# Patient Record
Sex: Male | Born: 1940 | Race: Black or African American | Hispanic: No | Smoking: Never smoker
Health system: Southern US, Community
[De-identification: ages and names within clinical notes are randomized; demographics above are authoritative.]

## PROBLEM LIST (undated history)

## (undated) DIAGNOSIS — I10 Essential (primary) hypertension: Secondary | ICD-10-CM

---

## 2002-12-02 ENCOUNTER — Encounter: Payer: Self-pay | Admitting: *Deleted

## 2002-12-02 ENCOUNTER — Encounter: Payer: Self-pay | Admitting: Emergency Medicine

## 2002-12-02 ENCOUNTER — Inpatient Hospital Stay (HOSPITAL_COMMUNITY): Admission: EM | Admit: 2002-12-02 | Discharge: 2002-12-02 | Payer: Self-pay | Admitting: Emergency Medicine

## 2002-12-30 ENCOUNTER — Encounter: Payer: Self-pay | Admitting: Family Medicine

## 2002-12-30 ENCOUNTER — Ambulatory Visit (HOSPITAL_COMMUNITY): Admission: RE | Admit: 2002-12-30 | Discharge: 2002-12-30 | Payer: Self-pay | Admitting: Family Medicine

## 2003-03-07 ENCOUNTER — Inpatient Hospital Stay (HOSPITAL_COMMUNITY): Admission: RE | Admit: 2003-03-07 | Discharge: 2003-03-11 | Payer: Self-pay | Admitting: Urology

## 2003-03-07 ENCOUNTER — Encounter (INDEPENDENT_AMBULATORY_CARE_PROVIDER_SITE_OTHER): Payer: Self-pay | Admitting: Specialist

## 2005-01-17 ENCOUNTER — Encounter: Admission: RE | Admit: 2005-01-17 | Discharge: 2005-04-17 | Payer: Self-pay | Admitting: Family Medicine

## 2007-02-02 ENCOUNTER — Ambulatory Visit: Payer: Self-pay | Admitting: Family Medicine

## 2007-02-02 DIAGNOSIS — K219 Gastro-esophageal reflux disease without esophagitis: Secondary | ICD-10-CM

## 2007-02-02 DIAGNOSIS — I10 Essential (primary) hypertension: Secondary | ICD-10-CM | POA: Insufficient documentation

## 2007-02-02 DIAGNOSIS — M129 Arthropathy, unspecified: Secondary | ICD-10-CM | POA: Insufficient documentation

## 2007-02-02 DIAGNOSIS — D494 Neoplasm of unspecified behavior of bladder: Secondary | ICD-10-CM | POA: Insufficient documentation

## 2007-02-02 DIAGNOSIS — E785 Hyperlipidemia, unspecified: Secondary | ICD-10-CM

## 2007-02-02 DIAGNOSIS — J309 Allergic rhinitis, unspecified: Secondary | ICD-10-CM | POA: Insufficient documentation

## 2007-02-02 DIAGNOSIS — R1310 Dysphagia, unspecified: Secondary | ICD-10-CM | POA: Insufficient documentation

## 2007-02-03 ENCOUNTER — Encounter (INDEPENDENT_AMBULATORY_CARE_PROVIDER_SITE_OTHER): Payer: Self-pay | Admitting: Family Medicine

## 2007-02-06 ENCOUNTER — Ambulatory Visit (HOSPITAL_COMMUNITY): Admission: RE | Admit: 2007-02-06 | Discharge: 2007-02-06 | Payer: Self-pay | Admitting: Family Medicine

## 2007-02-06 ENCOUNTER — Telehealth (INDEPENDENT_AMBULATORY_CARE_PROVIDER_SITE_OTHER): Payer: Self-pay | Admitting: Family Medicine

## 2007-02-06 ENCOUNTER — Encounter (INDEPENDENT_AMBULATORY_CARE_PROVIDER_SITE_OTHER): Payer: Self-pay | Admitting: Family Medicine

## 2007-02-06 LAB — CONVERTED CEMR LAB
Albumin: 4.2 g/dL (ref 3.5–5.2)
Alkaline Phosphatase: 68 units/L (ref 39–117)
BUN: 12 mg/dL (ref 6–23)
Bilirubin Urine: NEGATIVE
Creatinine, Ser: 1.52 mg/dL — ABNORMAL HIGH (ref 0.40–1.50)
Eosinophils Absolute: 0.1 10*3/uL (ref 0.0–0.7)
Eosinophils Relative: 2 % (ref 0–5)
Glucose, Bld: 89 mg/dL (ref 70–99)
HCT: 26 % — ABNORMAL LOW (ref 39.0–52.0)
HDL: 63 mg/dL (ref >39)
Hemoglobin: 6.5 g/dL — CL (ref 13.0–17.0)
LDL Cholesterol: 104 mg/dL — ABNORMAL HIGH (ref 0–99)
Lymphs Abs: 1 10*3/uL (ref 0.7–3.3)
MCV: 61.6 fL — ABNORMAL LOW (ref 78.0–100.0)
Monocytes Absolute: 0.4 10*3/uL (ref 0.2–0.7)
Monocytes Relative: 8 % (ref 3–11)
Neutrophils Relative %: 70 % (ref 43–77)
Potassium: 4.3 meq/L (ref 3.5–5.3)
RBC / HPF: NONE SEEN (ref <3)
RBC: 4.11 M/uL — ABNORMAL LOW (ref 4.22–5.81)
Specific Gravity, Urine: 1.011 (ref 1.005–1.03)
Triglycerides: 61 mg/dL (ref <150)
Urine Glucose: NEGATIVE mg/dL
WBC: 5.2 10*3/uL (ref 4.0–10.5)

## 2007-02-09 ENCOUNTER — Ambulatory Visit: Payer: Self-pay | Admitting: Family Medicine

## 2007-02-09 ENCOUNTER — Telehealth (INDEPENDENT_AMBULATORY_CARE_PROVIDER_SITE_OTHER): Payer: Self-pay | Admitting: Family Medicine

## 2007-02-09 ENCOUNTER — Ambulatory Visit (HOSPITAL_COMMUNITY): Admission: RE | Admit: 2007-02-09 | Discharge: 2007-02-09 | Payer: Self-pay | Admitting: Family Medicine

## 2007-02-09 DIAGNOSIS — D539 Nutritional anemia, unspecified: Secondary | ICD-10-CM | POA: Insufficient documentation

## 2007-02-09 LAB — CONVERTED CEMR LAB: Hemoglobin: 6.4 g/dL

## 2007-02-10 ENCOUNTER — Encounter (HOSPITAL_COMMUNITY): Admission: RE | Admit: 2007-02-10 | Discharge: 2007-03-12 | Payer: Self-pay | Admitting: Family Medicine

## 2007-02-10 ENCOUNTER — Ambulatory Visit (HOSPITAL_COMMUNITY): Payer: Self-pay | Admitting: Family Medicine

## 2007-02-10 LAB — CONVERTED CEMR LAB
Basophils Absolute: 0.1 10*3/uL (ref 0.0–0.1)
Eosinophils Absolute: 0.2 10*3/uL (ref 0.0–0.7)
Eosinophils Relative: 3 % (ref 0–5)
Ferritin: 1 ng/mL — ABNORMAL LOW (ref 22–322)
HCT: 26.5 % — ABNORMAL LOW (ref 39.0–52.0)
Iron: <10 ug/dL — ABNORMAL LOW (ref 42–165)
MCV: 61.5 fL — ABNORMAL LOW (ref 78.0–100.0)
Platelets: 348 10*3/uL (ref 150–400)
RDW: 18.8 % — ABNORMAL HIGH (ref 11.5–14.0)
Retic Ct Pct: 0.9 % (ref 0.4–3.1)

## 2007-02-11 ENCOUNTER — Telehealth (INDEPENDENT_AMBULATORY_CARE_PROVIDER_SITE_OTHER): Payer: Self-pay | Admitting: *Deleted

## 2007-03-06 ENCOUNTER — Encounter (INDEPENDENT_AMBULATORY_CARE_PROVIDER_SITE_OTHER): Payer: Self-pay | Admitting: Family Medicine

## 2007-03-06 ENCOUNTER — Ambulatory Visit (HOSPITAL_COMMUNITY): Admission: RE | Admit: 2007-03-06 | Discharge: 2007-03-06 | Payer: Self-pay | Admitting: Internal Medicine

## 2007-03-06 ENCOUNTER — Ambulatory Visit: Payer: Self-pay | Admitting: Internal Medicine

## 2007-03-09 ENCOUNTER — Telehealth (INDEPENDENT_AMBULATORY_CARE_PROVIDER_SITE_OTHER): Payer: Self-pay | Admitting: Family Medicine

## 2007-03-10 ENCOUNTER — Telehealth (INDEPENDENT_AMBULATORY_CARE_PROVIDER_SITE_OTHER): Payer: Self-pay | Admitting: Family Medicine

## 2007-03-13 ENCOUNTER — Encounter (INDEPENDENT_AMBULATORY_CARE_PROVIDER_SITE_OTHER): Payer: Self-pay | Admitting: Family Medicine

## 2007-05-19 ENCOUNTER — Encounter (INDEPENDENT_AMBULATORY_CARE_PROVIDER_SITE_OTHER): Payer: Self-pay | Admitting: Family Medicine

## 2007-12-07 ENCOUNTER — Encounter (INDEPENDENT_AMBULATORY_CARE_PROVIDER_SITE_OTHER): Payer: Self-pay | Admitting: Family Medicine

## 2007-12-08 ENCOUNTER — Encounter (INDEPENDENT_AMBULATORY_CARE_PROVIDER_SITE_OTHER): Payer: Self-pay | Admitting: Family Medicine

## 2008-12-08 ENCOUNTER — Ambulatory Visit: Payer: Self-pay | Admitting: Family Medicine

## 2008-12-08 DIAGNOSIS — K047 Periapical abscess without sinus: Secondary | ICD-10-CM

## 2008-12-12 ENCOUNTER — Ambulatory Visit: Payer: Self-pay | Admitting: Family Medicine

## 2009-01-27 ENCOUNTER — Ambulatory Visit: Payer: Self-pay | Admitting: Family Medicine

## 2009-01-27 ENCOUNTER — Inpatient Hospital Stay (HOSPITAL_COMMUNITY): Admission: AD | Admit: 2009-01-27 | Discharge: 2009-02-06 | Payer: Self-pay

## 2009-01-27 LAB — CONVERTED CEMR LAB
Basophils Relative: 1 % (ref 0–1)
HDL goal, serum: 40 mg/dL
MCHC: 28.6 g/dL — ABNORMAL LOW (ref 30.0–36.0)
Monocytes Relative: 7 % (ref 3–12)
Neutro Abs: 7.2 10*3/uL (ref 1.7–7.7)
Neutrophils Relative %: 80 % — ABNORMAL HIGH (ref 43–77)
OCCULT 1: POSITIVE
Platelets: 487 10*3/uL — ABNORMAL HIGH (ref 150–400)
RBC: 3.05 M/uL — ABNORMAL LOW (ref 4.22–5.81)
WBC: 8.8 10*3/uL (ref 4.0–10.5)

## 2009-01-28 ENCOUNTER — Ambulatory Visit: Payer: Self-pay | Admitting: Internal Medicine

## 2009-01-30 ENCOUNTER — Ambulatory Visit: Payer: Self-pay | Admitting: Internal Medicine

## 2009-01-30 ENCOUNTER — Encounter: Payer: Self-pay | Admitting: Internal Medicine

## 2009-01-31 ENCOUNTER — Ambulatory Visit: Payer: Self-pay | Admitting: Oncology

## 2009-02-01 ENCOUNTER — Encounter: Payer: Self-pay | Admitting: Internal Medicine

## 2009-02-01 ENCOUNTER — Encounter (INDEPENDENT_AMBULATORY_CARE_PROVIDER_SITE_OTHER): Payer: Self-pay | Admitting: General Surgery

## 2009-02-07 ENCOUNTER — Encounter: Payer: Self-pay | Admitting: Internal Medicine

## 2009-02-08 ENCOUNTER — Telehealth (INDEPENDENT_AMBULATORY_CARE_PROVIDER_SITE_OTHER): Payer: Self-pay | Admitting: *Deleted

## 2009-02-20 ENCOUNTER — Telehealth (INDEPENDENT_AMBULATORY_CARE_PROVIDER_SITE_OTHER): Payer: Self-pay | Admitting: Family Medicine

## 2009-02-20 ENCOUNTER — Encounter: Payer: Self-pay | Admitting: Internal Medicine

## 2010-03-01 ENCOUNTER — Encounter (INDEPENDENT_AMBULATORY_CARE_PROVIDER_SITE_OTHER): Payer: Self-pay

## 2010-09-17 ENCOUNTER — Encounter: Payer: Self-pay | Admitting: General Surgery

## 2010-09-25 NOTE — Letter (Signed)
Summary: Recall Colonoscopy/Endoscopy, Change to Office Visit  [facility]  [address]   [place], [place] [address]   Phone: [phone]  Fax: [phone]      March 01, 2010   Wayne [name] [address] [facility] [address] [place], [place]  [address] 1940-10-09   Dear [name],   According to our records, it is time for you to schedule a Colonoscopy. Please call our office and ask to speak to the triage nurse to get this scheduled. We look forward to hearing from you soon.   Sincerely,   Cloria Spring LPN  [facility] Ph: [phone]   Fax: [phone]

## 2010-12-03 LAB — COMPREHENSIVE METABOLIC PANEL
ALT: 10 U/L (ref 0–53)
ALT: 10 U/L (ref 0–53)
ALT: 13 U/L (ref 0–53)
ALT: 9 U/L (ref 0–53)
AST: 14 U/L (ref 0–37)
AST: 16 U/L (ref 0–37)
AST: 16 U/L (ref 0–37)
AST: 19 U/L (ref 0–37)
AST: 21 U/L (ref 0–37)
AST: 23 U/L (ref 0–37)
Albumin: 2.5 g/dL — ABNORMAL LOW (ref 3.5–5.2)
Albumin: 2.6 g/dL — ABNORMAL LOW (ref 3.5–5.2)
Albumin: 3.1 g/dL — ABNORMAL LOW (ref 3.5–5.2)
Albumin: 3.4 g/dL — ABNORMAL LOW (ref 3.5–5.2)
Albumin: 3.8 g/dL (ref 3.5–5.2)
Albumin: 3.9 g/dL (ref 3.5–5.2)
Alkaline Phosphatase: 60 U/L (ref 39–117)
Alkaline Phosphatase: 67 U/L (ref 39–117)
Alkaline Phosphatase: 75 U/L (ref 39–117)
BUN: 7 mg/dL (ref 6–23)
BUN: 7 mg/dL (ref 6–23)
BUN: 8 mg/dL (ref 6–23)
CO2: 24 mEq/L (ref 19–32)
CO2: 27 mEq/L (ref 19–32)
Calcium: 10.1 mg/dL (ref 8.4–10.5)
Calcium: 10.3 mg/dL (ref 8.4–10.5)
Calcium: 9.6 mg/dL (ref 8.4–10.5)
Calcium: 9.6 mg/dL (ref 8.4–10.5)
Chloride: 101 mEq/L (ref 96–112)
Chloride: 103 mEq/L (ref 96–112)
Chloride: 105 mEq/L (ref 96–112)
Creatinine, Ser: 1.02 mg/dL (ref 0.4–1.5)
Creatinine, Ser: 1.19 mg/dL (ref 0.4–1.5)
Creatinine, Ser: 1.19 mg/dL (ref 0.4–1.5)
Creatinine, Ser: 1.29 mg/dL (ref 0.4–1.5)
GFR calc Af Amer: >60 mL/min (ref >60)
GFR calc Af Amer: >60 mL/min (ref >60)
GFR calc Af Amer: >60 mL/min (ref >60)
GFR calc Af Amer: >60 mL/min (ref >60)
GFR calc Af Amer: >60 mL/min (ref >60)
GFR calc non Af Amer: 55 mL/min — ABNORMAL LOW (ref >60)
GFR calc non Af Amer: >60 mL/min (ref >60)
GFR calc non Af Amer: >60 mL/min (ref >60)
GFR calc non Af Amer: >60 mL/min (ref >60)
GFR calc non Af Amer: >60 mL/min (ref >60)
Glucose, Bld: 131 mg/dL — ABNORMAL HIGH (ref 70–99)
Potassium: 3.7 mEq/L (ref 3.5–5.1)
Potassium: 3.9 mEq/L (ref 3.5–5.1)
Potassium: 3.9 mEq/L (ref 3.5–5.1)
Sodium: 133 mEq/L — ABNORMAL LOW (ref 135–145)
Sodium: 135 mEq/L (ref 135–145)
Sodium: 136 mEq/L (ref 135–145)
Sodium: 137 mEq/L (ref 135–145)
Sodium: 138 mEq/L (ref 135–145)
Total Bilirubin: 1.2 mg/dL (ref 0.3–1.2)
Total Bilirubin: 1.3 mg/dL — ABNORMAL HIGH (ref 0.3–1.2)
Total Bilirubin: 1.3 mg/dL — ABNORMAL HIGH (ref 0.3–1.2)
Total Protein: 6.1 g/dL (ref 6.0–8.3)
Total Protein: 6.5 g/dL (ref 6.0–8.3)
Total Protein: 6.5 g/dL (ref 6.0–8.3)

## 2010-12-03 LAB — DIFFERENTIAL
Basophils Absolute: 0 10*3/uL (ref 0.0–0.1)
Basophils Absolute: 0 10*3/uL (ref 0.0–0.1)
Basophils Absolute: 0 10*3/uL (ref 0.0–0.1)
Basophils Absolute: 0 10*3/uL (ref 0.0–0.1)
Basophils Absolute: 0.1 10*3/uL (ref 0.0–0.1)
Basophils Absolute: 0.1 10*3/uL (ref 0.0–0.1)
Basophils Absolute: 0.1 10*3/uL (ref 0.0–0.1)
Basophils Absolute: 0.1 10*3/uL (ref 0.0–0.1)
Basophils Absolute: 0.1 10*3/uL (ref 0.0–0.1)
Basophils Absolute: 0.1 10*3/uL (ref 0.0–0.1)
Basophils Absolute: 0.1 10*3/uL (ref 0.0–0.1)
Basophils Absolute: 0.1 10*3/uL (ref 0.0–0.1)
Basophils Absolute: 0.6 10*3/uL — ABNORMAL HIGH (ref 0.0–0.1)
Basophils Relative: 0 % (ref 0–1)
Basophils Relative: 0 % (ref 0–1)
Basophils Relative: 0 % (ref 0–1)
Basophils Relative: 0 % (ref 0–1)
Basophils Relative: 0 % (ref 0–1)
Basophils Relative: 0 % (ref 0–1)
Basophils Relative: 0 % (ref 0–1)
Basophils Relative: 1 % (ref 0–1)
Basophils Relative: 1 % (ref 0–1)
Basophils Relative: 1 % (ref 0–1)
Basophils Relative: 1 % (ref 0–1)
Eosinophils Absolute: 0 10*3/uL (ref 0.0–0.7)
Eosinophils Absolute: 0 10*3/uL (ref 0.0–0.7)
Eosinophils Absolute: 0.1 10*3/uL (ref 0.0–0.7)
Eosinophils Absolute: 0.1 10*3/uL (ref 0.0–0.7)
Eosinophils Absolute: 0.1 10*3/uL (ref 0.0–0.7)
Eosinophils Absolute: 0.1 10*3/uL (ref 0.0–0.7)
Eosinophils Absolute: 0.2 10*3/uL (ref 0.0–0.7)
Eosinophils Absolute: 0.2 10*3/uL (ref 0.0–0.7)
Eosinophils Absolute: 0.2 10*3/uL (ref 0.0–0.7)
Eosinophils Absolute: 0.3 10*3/uL (ref 0.0–0.7)
Eosinophils Absolute: 0.3 10*3/uL (ref 0.0–0.7)
Eosinophils Relative: 0 % (ref 0–5)
Eosinophils Relative: 0 % (ref 0–5)
Eosinophils Relative: 0 % (ref 0–5)
Eosinophils Relative: 0 % (ref 0–5)
Eosinophils Relative: 0 % (ref 0–5)
Eosinophils Relative: 0 % (ref 0–5)
Eosinophils Relative: 0 % (ref 0–5)
Eosinophils Relative: 0 % (ref 0–5)
Eosinophils Relative: 1 % (ref 0–5)
Eosinophils Relative: 1 % (ref 0–5)
Eosinophils Relative: 2 % (ref 0–5)
Eosinophils Relative: 2 % (ref 0–5)
Eosinophils Relative: 2 % (ref 0–5)
Eosinophils Relative: 3 % (ref 0–5)
Eosinophils Relative: 3 % (ref 0–5)
Eosinophils Relative: 3 % (ref 0–5)
Eosinophils Relative: 3 % (ref 0–5)
Lymphocytes Relative: 12 % (ref 12–46)
Lymphocytes Relative: 15 % (ref 12–46)
Lymphocytes Relative: 17 % (ref 12–46)
Lymphocytes Relative: 18 % (ref 12–46)
Lymphocytes Relative: 4 % — ABNORMAL LOW (ref 12–46)
Lymphocytes Relative: 5 % — ABNORMAL LOW (ref 12–46)
Lymphocytes Relative: 5 % — ABNORMAL LOW (ref 12–46)
Lymphocytes Relative: 7 % — ABNORMAL LOW (ref 12–46)
Lymphs Abs: 0.4 10*3/uL — ABNORMAL LOW (ref 0.7–4.0)
Lymphs Abs: 0.5 10*3/uL — ABNORMAL LOW (ref 0.7–4.0)
Lymphs Abs: 0.5 10*3/uL — ABNORMAL LOW (ref 0.7–4.0)
Lymphs Abs: 0.5 10*3/uL — ABNORMAL LOW (ref 0.7–4.0)
Lymphs Abs: 0.6 10*3/uL — ABNORMAL LOW (ref 0.7–4.0)
Lymphs Abs: 0.6 10*3/uL — ABNORMAL LOW (ref 0.7–4.0)
Lymphs Abs: 0.9 10*3/uL (ref 0.7–4.0)
Lymphs Abs: 1 10*3/uL (ref 0.7–4.0)
Lymphs Abs: 1 10*3/uL (ref 0.7–4.0)
Lymphs Abs: 1 10*3/uL (ref 0.7–4.0)
Lymphs Abs: 1 10*3/uL (ref 0.7–4.0)
Lymphs Abs: 1.2 10*3/uL (ref 0.7–4.0)
Lymphs Abs: 1.2 10*3/uL (ref 0.7–4.0)
Lymphs Abs: 1.2 10*3/uL (ref 0.7–4.0)
Lymphs Abs: 1.4 10*3/uL (ref 0.7–4.0)
Lymphs Abs: 1.5 10*3/uL (ref 0.7–4.0)
Lymphs Abs: 1.6 10*3/uL (ref 0.7–4.0)
Metamyelocytes Relative: 0 %
Monocytes Absolute: 0.4 10*3/uL (ref 0.1–1.0)
Monocytes Absolute: 0.5 10*3/uL (ref 0.1–1.0)
Monocytes Absolute: 0.5 10*3/uL (ref 0.1–1.0)
Monocytes Absolute: 0.6 10*3/uL (ref 0.1–1.0)
Monocytes Absolute: 0.6 10*3/uL (ref 0.1–1.0)
Monocytes Absolute: 0.6 10*3/uL (ref 0.1–1.0)
Monocytes Absolute: 0.6 10*3/uL (ref 0.1–1.0)
Monocytes Absolute: 0.7 10*3/uL (ref 0.1–1.0)
Monocytes Absolute: 0.7 10*3/uL (ref 0.1–1.0)
Monocytes Absolute: 0.8 10*3/uL (ref 0.1–1.0)
Monocytes Absolute: 0.8 10*3/uL (ref 0.1–1.0)
Monocytes Absolute: 0.9 10*3/uL (ref 0.1–1.0)
Monocytes Absolute: 1.3 10*3/uL — ABNORMAL HIGH (ref 0.1–1.0)
Monocytes Relative: 1 % — ABNORMAL LOW (ref 3–12)
Monocytes Relative: 11 % (ref 3–12)
Monocytes Relative: 2 % — ABNORMAL LOW (ref 3–12)
Monocytes Relative: 2 % — ABNORMAL LOW (ref 3–12)
Monocytes Relative: 3 % (ref 3–12)
Monocytes Relative: 3 % (ref 3–12)
Monocytes Relative: 4 % (ref 3–12)
Monocytes Relative: 7 % (ref 3–12)
Monocytes Relative: 8 % (ref 3–12)
Monocytes Relative: 8 % (ref 3–12)
Monocytes Relative: 8 % (ref 3–12)
Monocytes Relative: 9 % (ref 3–12)
Myelocytes: 0 %
Myelocytes: 0 %
Neutro Abs: 12.4 10*3/uL — ABNORMAL HIGH (ref 1.7–7.7)
Neutro Abs: 19.1 10*3/uL — ABNORMAL HIGH (ref 1.7–7.7)
Neutro Abs: 23.5 10*3/uL — ABNORMAL HIGH (ref 1.7–7.7)
Neutro Abs: 24.1 10*3/uL — ABNORMAL HIGH (ref 1.7–7.7)
Neutro Abs: 24.8 10*3/uL — ABNORMAL HIGH (ref 1.7–7.7)
Neutro Abs: 26.4 10*3/uL — ABNORMAL HIGH (ref 1.7–7.7)
Neutro Abs: 27 10*3/uL — ABNORMAL HIGH (ref 1.7–7.7)
Neutro Abs: 4.4 10*3/uL (ref 1.7–7.7)
Neutro Abs: 5.1 10*3/uL (ref 1.7–7.7)
Neutro Abs: 5.4 10*3/uL (ref 1.7–7.7)
Neutro Abs: 5.8 10*3/uL (ref 1.7–7.7)
Neutro Abs: 6.1 10*3/uL (ref 1.7–7.7)
Neutro Abs: 6.7 10*3/uL (ref 1.7–7.7)
Neutro Abs: 6.8 10*3/uL (ref 1.7–7.7)
Neutro Abs: 7.8 10*3/uL — ABNORMAL HIGH (ref 1.7–7.7)
Neutro Abs: 8 10*3/uL — ABNORMAL HIGH (ref 1.7–7.7)
Neutro Abs: 8.6 10*3/uL — ABNORMAL HIGH (ref 1.7–7.7)
Neutro Abs: 9 10*3/uL — ABNORMAL HIGH (ref 1.7–7.7)
Neutrophils Relative %: 67 % (ref 43–77)
Neutrophils Relative %: 70 % (ref 43–77)
Neutrophils Relative %: 73 % (ref 43–77)
Neutrophils Relative %: 73 % (ref 43–77)
Neutrophils Relative %: 75 % (ref 43–77)
Neutrophils Relative %: 76 % (ref 43–77)
Neutrophils Relative %: 83 % — ABNORMAL HIGH (ref 43–77)
Neutrophils Relative %: 90 % — ABNORMAL HIGH (ref 43–77)
Neutrophils Relative %: 91 % — ABNORMAL HIGH (ref 43–77)
Smear Review: INCREASED
nRBC: 0 /100 WBC

## 2010-12-03 LAB — CBC
HCT: 21.1 % — ABNORMAL LOW (ref 39.0–52.0)
HCT: 24.2 % — ABNORMAL LOW (ref 39.0–52.0)
HCT: 27 % — ABNORMAL LOW (ref 39.0–52.0)
HCT: 28.5 % — ABNORMAL LOW (ref 39.0–52.0)
HCT: 30.1 % — ABNORMAL LOW (ref 39.0–52.0)
HCT: 30.4 % — ABNORMAL LOW (ref 39.0–52.0)
HCT: 30.4 % — ABNORMAL LOW (ref 39.0–52.0)
HCT: 30.6 % — ABNORMAL LOW (ref 39.0–52.0)
HCT: 31.3 % — ABNORMAL LOW (ref 39.0–52.0)
HCT: 32 % — ABNORMAL LOW (ref 39.0–52.0)
HCT: 32.5 % — ABNORMAL LOW (ref 39.0–52.0)
HCT: 33.3 % — ABNORMAL LOW (ref 39.0–52.0)
HCT: 34.5 % — ABNORMAL LOW (ref 39.0–52.0)
Hemoglobin: 10.4 g/dL — ABNORMAL LOW (ref 13.0–17.0)
Hemoglobin: 10.6 g/dL — ABNORMAL LOW (ref 13.0–17.0)
Hemoglobin: 4.4 g/dL — CL (ref 13.0–17.0)
Hemoglobin: 9.1 g/dL — ABNORMAL LOW (ref 13.0–17.0)
Hemoglobin: 9.1 g/dL — ABNORMAL LOW (ref 13.0–17.0)
Hemoglobin: 9.3 g/dL — ABNORMAL LOW (ref 13.0–17.0)
Hemoglobin: 9.5 g/dL — ABNORMAL LOW (ref 13.0–17.0)
Hemoglobin: 9.6 g/dL — ABNORMAL LOW (ref 13.0–17.0)
Hemoglobin: 9.8 g/dL — ABNORMAL LOW (ref 13.0–17.0)
Hemoglobin: 9.9 g/dL — ABNORMAL LOW (ref 13.0–17.0)
Hemoglobin: 9.9 g/dL — ABNORMAL LOW (ref 13.0–17.0)
MCHC: 30.7 g/dL (ref 30.0–36.0)
MCHC: 30.9 g/dL (ref 30.0–36.0)
MCHC: 31.1 g/dL (ref 30.0–36.0)
MCHC: 31.1 g/dL (ref 30.0–36.0)
MCHC: 31.2 g/dL (ref 30.0–36.0)
MCHC: 31.2 g/dL (ref 30.0–36.0)
MCHC: 31.3 g/dL (ref 30.0–36.0)
MCHC: 31.4 g/dL (ref 30.0–36.0)
MCHC: 31.4 g/dL (ref 30.0–36.0)
MCHC: 31.4 g/dL (ref 30.0–36.0)
MCHC: 31.4 g/dL (ref 30.0–36.0)
MCHC: 31.4 g/dL (ref 30.0–36.0)
MCHC: 31.4 g/dL (ref 30.0–36.0)
MCHC: 31.5 g/dL (ref 30.0–36.0)
MCHC: 31.6 g/dL (ref 30.0–36.0)
MCHC: 31.8 g/dL (ref 30.0–36.0)
MCV: 62.8 fL — ABNORMAL LOW (ref 78.0–100.0)
MCV: 67.3 fL — ABNORMAL LOW (ref 78.0–100.0)
MCV: 67.6 fL — ABNORMAL LOW (ref 78.0–100.0)
MCV: 67.7 fL — ABNORMAL LOW (ref 78.0–100.0)
MCV: 68.2 fL — ABNORMAL LOW (ref 78.0–100.0)
MCV: 68.8 fL — ABNORMAL LOW (ref 78.0–100.0)
MCV: 68.9 fL — ABNORMAL LOW (ref 78.0–100.0)
MCV: 69.9 fL — ABNORMAL LOW (ref 78.0–100.0)
MCV: 70.3 fL — ABNORMAL LOW (ref 78.0–100.0)
MCV: 70.4 fL — ABNORMAL LOW (ref 78.0–100.0)
MCV: 70.4 fL — ABNORMAL LOW (ref 78.0–100.0)
MCV: 70.8 fL — ABNORMAL LOW (ref 78.0–100.0)
MCV: 71.1 fL — ABNORMAL LOW (ref 78.0–100.0)
MCV: 71.1 fL — ABNORMAL LOW (ref 78.0–100.0)
MCV: 71.2 fL — ABNORMAL LOW (ref 78.0–100.0)
MCV: 71.7 fL — ABNORMAL LOW (ref 78.0–100.0)
MCV: 71.8 fL — ABNORMAL LOW (ref 78.0–100.0)
MCV: 71.9 fL — ABNORMAL LOW (ref 78.0–100.0)
Platelets: 199 10*3/uL (ref 150–400)
Platelets: 223 10*3/uL (ref 150–400)
Platelets: 276 10*3/uL (ref 150–400)
Platelets: 298 10*3/uL (ref 150–400)
Platelets: 301 10*3/uL (ref 150–400)
Platelets: 308 10*3/uL (ref 150–400)
Platelets: 317 10*3/uL (ref 150–400)
Platelets: 339 10*3/uL (ref 150–400)
Platelets: 379 10*3/uL (ref 150–400)
Platelets: 382 10*3/uL (ref 150–400)
Platelets: 393 10*3/uL (ref 150–400)
Platelets: 423 10*3/uL — ABNORMAL HIGH (ref 150–400)
Platelets: 428 10*3/uL — ABNORMAL HIGH (ref 150–400)
Platelets: 430 10*3/uL — ABNORMAL HIGH (ref 150–400)
Platelets: 450 10*3/uL — ABNORMAL HIGH (ref 150–400)
Platelets: 454 10*3/uL — ABNORMAL HIGH (ref 150–400)
RBC: 3.7 MIL/uL — ABNORMAL LOW (ref 4.22–5.81)
RBC: 4.02 MIL/uL — ABNORMAL LOW (ref 4.22–5.81)
RBC: 4.08 MIL/uL — ABNORMAL LOW (ref 4.22–5.81)
RBC: 4.28 MIL/uL (ref 4.22–5.81)
RBC: 4.31 MIL/uL (ref 4.22–5.81)
RBC: 4.32 MIL/uL (ref 4.22–5.81)
RBC: 4.37 MIL/uL (ref 4.22–5.81)
RBC: 4.4 MIL/uL (ref 4.22–5.81)
RBC: 4.45 MIL/uL (ref 4.22–5.81)
RBC: 4.45 MIL/uL (ref 4.22–5.81)
RBC: 4.53 MIL/uL (ref 4.22–5.81)
RBC: 4.67 MIL/uL (ref 4.22–5.81)
RBC: 4.77 MIL/uL (ref 4.22–5.81)
RDW: 21.3 % — ABNORMAL HIGH (ref 11.5–15.5)
RDW: 32.3 % — ABNORMAL HIGH (ref 11.5–15.5)
RDW: 32.5 % — ABNORMAL HIGH (ref 11.5–15.5)
RDW: 35.1 % — ABNORMAL HIGH (ref 11.5–15.5)
RDW: 35.3 % — ABNORMAL HIGH (ref 11.5–15.5)
RDW: 36 % — ABNORMAL HIGH (ref 11.5–15.5)
RDW: 36.2 % — ABNORMAL HIGH (ref 11.5–15.5)
RDW: 36.5 % — ABNORMAL HIGH (ref 11.5–15.5)
RDW: 37 % — ABNORMAL HIGH (ref 11.5–15.5)
RDW: 37.4 % — ABNORMAL HIGH (ref 11.5–15.5)
RDW: 37.8 % — ABNORMAL HIGH (ref 11.5–15.5)
RDW: 38.4 % — ABNORMAL HIGH (ref 11.5–15.5)
WBC: 10.4 10*3/uL (ref 4.0–10.5)
WBC: 10.8 10*3/uL — ABNORMAL HIGH (ref 4.0–10.5)
WBC: 14.5 10*3/uL — ABNORMAL HIGH (ref 4.0–10.5)
WBC: 24.7 10*3/uL — ABNORMAL HIGH (ref 4.0–10.5)
WBC: 30.9 10*3/uL — ABNORMAL HIGH (ref 4.0–10.5)
WBC: 6.9 10*3/uL (ref 4.0–10.5)
WBC: 7.5 10*3/uL (ref 4.0–10.5)
WBC: 7.9 10*3/uL (ref 4.0–10.5)
WBC: 8.2 10*3/uL (ref 4.0–10.5)
WBC: 9.3 10*3/uL (ref 4.0–10.5)

## 2010-12-03 LAB — CROSSMATCH
ABO/RH(D): O NEG
ABO/RH(D): O NEG
Antibody Screen: NEGATIVE
Antibody Screen: NEGATIVE

## 2010-12-03 LAB — IRON AND TIBC
Iron: <10 ug/dL — ABNORMAL LOW (ref 42–135)
UIBC: 378 ug/dL

## 2010-12-03 LAB — LIPID PANEL
LDL Cholesterol: 101 mg/dL — ABNORMAL HIGH (ref 0–99)
Total CHOL/HDL Ratio: 3.2 RATIO
Triglycerides: 65 mg/dL (ref <150)
VLDL: 13 mg/dL (ref 0–40)

## 2010-12-03 LAB — BASIC METABOLIC PANEL
BUN: 1 mg/dL — ABNORMAL LOW (ref 6–23)
CO2: 25 mEq/L (ref 19–32)
CO2: 26 mEq/L (ref 19–32)
Calcium: 10.6 mg/dL — ABNORMAL HIGH (ref 8.4–10.5)
Calcium: 9.9 mg/dL (ref 8.4–10.5)
Creatinine, Ser: 1.15 mg/dL (ref 0.4–1.5)
Creatinine, Ser: 1.31 mg/dL (ref 0.4–1.5)
GFR calc Af Amer: >60 mL/min (ref >60)
GFR calc Af Amer: >60 mL/min (ref >60)

## 2010-12-03 LAB — MAGNESIUM
Magnesium: 1.4 mg/dL — ABNORMAL LOW (ref 1.5–2.5)
Magnesium: 1.9 mg/dL (ref 1.5–2.5)

## 2010-12-03 LAB — PREPARE RBC (CROSSMATCH)

## 2010-12-03 LAB — CEA: CEA: 2.1 ng/mL (ref 0.0–5.0)

## 2010-12-03 LAB — VITAMIN B12: Vitamin B-12: 325 pg/mL (ref 211–911)

## 2010-12-03 LAB — PHOSPHORUS: Phosphorus: 2.2 mg/dL — ABNORMAL LOW (ref 2.3–4.6)

## 2010-12-03 LAB — FERRITIN: Ferritin: <1 ng/mL — ABNORMAL LOW (ref 22–322)

## 2010-12-03 LAB — T4, FREE: Free T4: 1.11 ng/dL (ref 0.80–1.80)

## 2010-12-03 LAB — IRON: Iron: 35 ug/dL — ABNORMAL LOW (ref 42–135)

## 2011-01-08 NOTE — Op Note (Signed)
NAME:  Wayne, [name] NO.:  [id number]   MEDICAL RECORD NO.:  [id number]          PATIENT TYPE:  INP   LOCATION:  A320                          FACILITY:  [facility]   PHYSICIAN:  Tilford Pillar, MD      DATE OF BIRTH:  Jan 30, 1941   DATE OF PROCEDURE:  02/01/2009  DATE OF DISCHARGE:                               OPERATIVE REPORT   PREOPERATIVE DIAGNOSIS:  Rectosigmoid mass.   POSTOPERATIVE DIAGNOSIS:  Sigmoid mass.   PROCEDURES:  1. Laparoscopic hand-assisted sigmoid colectomy with mobilization of      the splenic flexure.  2. Rigid proctoscopy.   SURGEON:  Tilford Pillar, MD   ANESTHESIA:  General endotracheal.   LOCAL ANESTHETIC:  None.   SPECIMEN:  Sigmoid colon and anastomotic doughnuts x2.   ESTIMATED BLOOD LOSS:  Approximately 50 mL.   INDICATIONS:  The patient is an unfortunate 70 year old male who  presented to [facility] with chronic anemia.  He had had  unsatisfactory colonoscopy in the past and did not have a new  colonoscopy until this admission.  The colonoscopy was noted by Dr.  Jena Gauss.  The patient had a large near obstructing colon mass, which was  impossible to pass either a regular or pediatric scope beyond.  Based on  the findings, the patient was worked up for a suspicion of a colon  cancer with no radiographic evidence of significant metastases or  involvement of nonoperative structures in the area.  It was discussed  with the patient the risks, benefits, and alternatives of a laparoscopic  possible partial colectomy, as well as the possibility of requiring a  temporary versus permanent colostomy.  Risks, benefits, and alternatives  were discussed including, but not limited to risk of bleeding,  infection, anastomotic leak.  Ureter and distant bowel injury, as well  as possibility of intraoperative cardiac and pulmonary events.  The  patient's questions and concerns were addressed and the patient was  consented for the planned  procedure.   OPERATION:  The patient was taken to the operating room, was placed in  supine position on the operating room table, at which time the general  anesthetic was administered.  Once the patient was asleep, he was  endotracheally intubated by Anesthesia.  Once the patient was intubated,  his abdomen was prepped.  He had a Foley catheter placed in standard  sterile fashion by the operating room staff.  His abdomen, perineum, and  rectum were prepped with Betadine solution.  This was connected after  the patient had been placed in Yellofin stirrups.  Upon completion of  the prep, the patient was draped in standard fashion.  At this time, I  did create a stab incision at palmaris point in the left subcostal  margin using a 11-blade scalpel.  A Veress needle was inserted.  Saline  drop test was utilized to confirm intraperitoneal placement and then  pneumoperitoneum was initiated.  Once sufficient pneumoperitoneum was  obtained, a stab incision was created in the epigastrium, for which an  11-mm trocar was inserted.  This was placed over a laparoscope allowing  visualization of the trocar entering into peritoneal cavity.  At this  time, the inner cannula was removed.  The laparoscope was reinserted.  There is no evidence of any trocar or Veress needle placement injury.  At this time, the Veress needle was removed.  At this time, a right  lower quadrant stab incision was created again with 11-blade scalpel.  Through this, an additional 11-mm trocar was inserted and then a small  midline incision was created with a right lateral keyhole defect around  the umbilicus, for which took place a hand port.  Upon creating the  fascial defect, this was enlarged adequately to place my hand and with  this completed, the hand port was positioned.  Pneumoperitoneum was  reobtained.  Upon placement of hand, I did palpate the colon.  The mass  was easily palpated within the sigmoid colon, there was  some redundancy  and this was able to be elevated up out of the pelvis clearly proximal  to the rectosigmoid junction.  Additional palpation of the colon, I did  not feel any other areas of suspicion.  Small bowel appeared normal.  No  palpable abnormalities were noted.  No nodularity was noted in the  pelvis or peritoneal cavity.  Palpation of the liver did not demonstrate  any nodularity or masses within the liver, somewhere within the stomach  and the spleen.  At this time, I did proceed with the planned sigmoid  resection.  Using a combination of harmonic and LigaSure dissection, I  did free several omental adhesions within the pelvis from the patient's  prior pelvic and bladder operation.  With these free, I then turned  attention to mobilizing the colon.   Using the harmonic scalpel, I divided the peritoneal reflection along  the left lateral abdominal wall.  This mobilized the descending colon  adequately.  I continued with dissection towards the splenic flexure,  again using the harmonic scalpel to free the splenocolic ligament.  This  provided adequate length for the descending colon to easily reach the  pelvic brim.  Having completely freed the distal colon down to the  rectum, I then created a small defect behind the rectum using the  harmonic scalpel, then continued to enlarge this with blunt finger  dilatation.  With adequate opening, I then brought a reticulating Endo-  GIA 45 stapler to the field and used a regular load to divide the rectum  with cyst.  Due to the size of the rectum and a small remnant that  remained after the initial firing, a second load of the articulating  Endo-GIA was required to complete the division.  At this time, the colon  was divided distally and used the same LigaSure to divide the mesocolon  proximally and the sigmoid colon after passing the mass by approximately  5-10 cm.  I identified a segment of bowel that appeared healthy and  viable and  this again easily reached the pelvic brim.  At this time, I  brought the freed portion of the sigmoid colon to the hand port allowing  decompression of the pneumoperitoneum.   At this time, I continued the procedure extracorporeally.  Using a Kelly  clamp, I clamped proximal to the planned site of incision and used a  automatic pursestring device to place a pursestring suture, which is  proximal to the planned site of bowel transection.  This was performed  after clearing the planned area of fatty appendages with the pursestring  suture placed.  The bowel was divided sharply with scalpel.  The bowel  was placed and a free tie was placed on the back table, to be sent as a  permanent specimen to pathology.  Hemostasis was excellent.  Upon  removal a the automatic pursestring device, lumen of the colon appeared  viable and at this point, I placed a 28-mm anvil for an EEA stapler into  the proximally divided segment of bowel.  The pursestring suture was  then secured and attention was turned to creation of the anastomosis.   At this time, the bowel was returned back into the abdominal cavity.  Pneumoperitoneum hand port was repositioned, was replaced and the  pneumoperitoneum was reestablished.  At this time I did proceed below to  perform the passage of the EEA stapler.  Initial finger dilatation of  the anus was performed followed by serial dilatation with EEA sizers.  Upon reaching the 28 sizer, I then advanced the EEA stapler without  difficulties.  This was advanced just distal to the rectal staple line.  The spike of the EEA stapler was advanced with good penetration to the  bowel wall and with nursing assistance, the anvil was connected after  ensuring that there was no twisting of the descending colon being  pleased with the appearance of the two ends of bowel.  I then fired and  closed the EEA stapler in standard fashion and fired again in standard  fashion.  Upon removal of the EEA  stapler, inspection of the anastomotic  doughnuts demonstrated two complete doughnuts during removal of the  proximal doughnut was disrupted.  It was noted that the doughnut was  complete upon initial inspection.  Both doughnuts were sent with the  specimen as a permanent specimen to pathology.   At this time, I proceeded with inspection of the anastomoses advancing a  rigid proctoscope, the lumen of the rectum was infused with air.  The  proximal descending colon was pinched, attention occluded by a nursing  assistant, and the pelvis was irrigated with sterile saline to cover the  anastomosis with insufflation of the colon and notable dilatation of the  proximal colon.  Beyond the anastomosis, there was no noted air leak  from the anastomoses.  Due to the high position of the anastomoses and  not looking to be disrupted with the rigid proctoscope.  I do not opt to  visually inspect the anastomoses at this time as the proctoscope was  nearly hugged in order to visualize the rectal mucosa.  At this time, I  did removed the proctoscope.  The fluid was aspirated from the abdominal  cavity and at this time, both myself and staff changed gloves and gowns.   Returning to the surgical field, I reinspected the anastomoses, both  visually with the laparoscope, as well as patient.  The lumen was widely  patent and was quite pleased with the appearance.  At this time, I  turned attention to closure.  Using an Endoclose suture passing device,  a 2-0 Vicryl suture was passed through both the 11-mm trocar sites.  At  this time, the pneumoperitoneum was evacuated.  The trocars removed.  The hand port was removed.  Omentum was placed over the small intestine  down to the pelvis and attention was turned to securing the Vicryl  suture.  Once the Vicryl sutures were secured, the fascia of the hand  port was reapproximated using a looped Novofil suture x2 and was started  superiorly, it was  brought through  the midportion of the incision.  Dissection was started too anterior and was brought to the initial.  These were secured to each other.  The tails were tucked into the  subcuticular tissue.  Skin was reapproximated using skin staples, both  trocar as well as hand port site.  At this time, the skin was washed and  dried with moist-to-dry towel.  Sterile dressings were placed over the  incision and secured with MediPort tape.  At this time, the drapes were  moved.  It was noted that there was a collection of air within the left  scrotum.  This was gently manipulated by myself to return the air back  into the peritoneal cavity and is noted that during the procedure, I did  note a small right inguinal hernia, as well as a left inguinal hernia.  This was obviously confirmed by the presence of pneumoperitoneum within  the left scrotum with having a return of the scrotum back to its normal  size.  The patient was allowed to come out of general anesthetic and was  transferred to postanesthetic care unit in stable condition.  At the  conclusion of procedure, all instrument, sponge, and needle counts were  reported correct.  The patient tolerated the procedure extremely well.      Tilford Pillar, MD  Electronically Signed     BZ/MEDQ  D:  02/01/2009  T:  02/02/2009  Job:  [id number]   cc:   Ladona Horns. Mariel Sleet, MD  Fax: [phone]   R. Roetta Sessions, M.D.  [address]  [place]  [place] [address]

## 2011-01-08 NOTE — Discharge Summary (Signed)
NAME:  Wayne [name], Wayne [name] NO.:  [id number]   MEDICAL RECORD NO.:  [id number]          PATIENT TYPE:  INP   LOCATION:  A320                          FACILITY:  [facility]   PHYSICIAN:  Tilford Pillar, MD      DATE OF BIRTH:  [birth year]   DATE OF ADMISSION:  01/27/2009  DATE OF DISCHARGE:  06/14/2010LH                               DISCHARGE SUMMARY   ADMISSION DIAGNOSIS:  Severe anemia.   DISCHARGE DIAGNOSES:  1. Status post sigmoid colectomy for colon cancer.  2. Hypertension.  3. Dyslipidemia.  4. Gastroesophageal reflux disease.  5. Allergic rhinitis.  6. History of dysphasia.   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   DISPOSITION:  Home.   BRIEF HISTORY AND PHYSICAL:  Please see the admission history and  physical for complete H and P.  The patient is a 70 year old male, who  presented with anemia after being evaluated in Dr. Simonne Martinet office.  He  had a hemoglobin on admission of April 4.  He was resuscitated for this,  however, evaluation was undertaken for the etiology.  He was therefore  admitted on April 10 for continued management and evaluation of this  anemia.   HOSPITAL COURSE:  The patient was initially resuscitated for his  hemoglobin on April 4, due to the high suspicion of colonic mass.  The  patient did undergo a colonoscopy and during the colonoscopy, a large  rectosigmoid mass was identified.  This was biopsied.  However, due to  its large size and near obstruction of the colonic lumen, a pediatric  gastro colonoscope was not able to be advanced past this mass, so  therefore the colonoscopy was limited to the rectosigmoid area.  Baseline additional workup with CT evaluation was obtained.  There is no  evidence of any metastatic disease.  No clear evidence of any proximal  disease in the colon was identified.  __________ study.  At this time,  the risks, benefits, and alternatives of the  sigmoid colectomy were  discussed with the patient.  He underwent the laparoscopic hand-assisted  colectomy on February 01, 2009.  He tolerated the procedure extremely well.  Spent a brief period in the postanesthetic care unit and he was  transferred to a regular hospital floor.  He continued to be monitored.  He had quick resolution of his bowel function.  He was switched to oral  analgesics.  He was advanced on his diet.  At this time, plans were made  for discharge.  The patient was discharged on February 06, 2009, as he was  tolerating a regular diet and as bowel function was normal, his pain was  controlled on oral pain medication.  The patient was ambulatory.   DISCHARGE INSTRUCTIONS:  The patient is to resume normal diet.  He is to  continue to the wash the incision with soap and water, used to increase  his activity slowly.  He may walk up steps.  He is not to lift anything  for the  next 5 weeks over 20 pounds.  He may shower,  but he is not to soak the  incision for the next 3 or 4 weeks.  He is not to drive while taking  pain medications.  He is return to see me in my office on Thursday June  17.  He is to continue all previously prescribed home medications.      Tilford Pillar, MD  Electronically Signed     BZ/MEDQ  D:  02/20/2009  T:  02/21/2009  Job:  [id number]

## 2011-01-08 NOTE — Group Therapy Note (Signed)
NAME:  Wayne [name], Wayne [name]              ACCOUNT NO.:  [id number]   MEDICAL RECORD NO.:  [id number]          PATIENT TYPE:  INP   LOCATION:  A320                          FACILITY:  [facility]   PHYSICIAN:  Margaretmary Dys, M.D.DATE OF BIRTH:  06/12/1941   DATE OF PROCEDURE:  01/29/2009  DATE OF DISCHARGE:                                 PROGRESS NOTE   SUBJECTIVE:  Patient is doing about the same.  Was walking around.  Plan  is for a colonoscopy and an EGD.  This is being planned by the  gastroenterologist for tomorrow morning.  Otherwise he feels fine.   OBJECTIVE:  CONSTITUTIONAL:  Conscious, alert, comfortable not in acute  distress.  Well-oriented in time, place and person.  The patient was  ambulating and moving around his room.  VITAL SIGNS:  Blood pressure was 134/73 with a pulse of 77, respirations  18, temperature 97.4 degrees Fahrenheit, oxygen saturation was 99% on  room air.  HEENT:  Normocephalic, atraumatic.  Oral mucosa dry.  NECK:  Supple.  No JVD or lymphadenopathy.  LUNGS:  Clear.  HEART:  S1, S2, no S3, S4 gallops or rubs.  ABDOMEN:  Soft, nontender.  Bowel sounds positive.  No masses palpable.  EXTREMITIES:  No edema.  CNS:  Exam was grossly intact.   LABORATORY DATA:  White blood count was 11.8, hemoglobin is improved to  10.5, hematocrit was 33.3, platelet count was 454,000.  These numbers  are consistent with microcytic hypochromic anemia.  Sodium is 137,  potassium is 3.9, chloride of 108, CO2 was 26, glucose 84, BUN is 7,  creatinine was 1.23, total bilirubin was 4.0.   ASSESSMENT/PLAN:  1. Severe anemia requiring blood transfusion likely chronic.  2. Hypertension, stable.  No medications.  3. History of dyslipidemia.  4. Gastroesophageal reflux disease on Protonix.  5. History of dysphagia.  6. Hyperbilirubinemia, unclear etiology.  May be related to ongoing      hemolysis.   PLAN:  1. Will continue current plan for colonoscopy and EGD tomorrow.  2. Will  hold for any blood transfusions at this time.  3. Blood pressure remains stable.  We will continue to monitor.  4. Overall the patient remains stable at this time.  We have EGD,      colonoscopy in the morning tomorrow.      Margaretmary Dys, M.D.  Electronically Signed     AM/MEDQ  D:  01/29/2009  T:  01/29/2009  Job:  [id number]

## 2011-01-08 NOTE — Consult Note (Signed)
NAME:  Wayne [name], Wayne [name] NO.:  [id number]   MEDICAL RECORD NO.:  [id number]          PATIENT TYPE:  INP   LOCATION:  A320                          FACILITY:  [facility]   PHYSICIAN:  Tilford Pillar, MD      DATE OF BIRTH:  Jul 14, 1941   DATE OF CONSULTATION:  01/31/2009  DATE OF DISCHARGE:                                 CONSULTATION   REASON FOR CONSULTATION:  Rectosigmoid mass.   HISTORY OF PRESENT ILLNESS:  The patient is a 70 year old male with a  history of hypertension, gastroesophageal reflux disease, hyperlipidemia  and recent diagnosis of a tooth abscess who presents with a history of  anemia.  The patient was referred to [facility] by Dr. Erby Pian  from his office after obtaining laboratory results of a hemoglobin of  4.4.  The patient has had a long-term history of anemia, and in fact in  2008 the patient was scheduled for colonoscopy which was unsuccessful  due to a poor prep.  Following this, the patient did fail to follow up  for any repeat colonoscopies.  He has had no complaints of bowel  changes, no history of melena and no hematochezia.  No constipation.  He  has been tolerating regular diet.  He has had no history of fever or  chills.  He denies any significant weight loss.  No change in appetite.  He denies any family history of colon cancers or gastrointestinal  disease or disorders.  The patient is aware of the recent colonoscopy  findings which he had yesterday.  Since that time, he has remained  comfortable.  He remains on a clear diet, and he continues to have bowel  function.   PAST MEDICAL HISTORY:  Hypertension, gastroesophageal reflux disease,  hyperlipidemia.  Dysphasia with a previous esophageal web, tooth  abscess, history of a bladder tumor.   PAST SURGICAL HISTORY:  He has had a bladder resection for a tumor as  well as resection of an esophageal web.   MEDICATIONS:  No current medications.   ALLERGIES:  No known drug  allergies.   SOCIAL HISTORY:  No tobacco.  No alcohol.  No recreational drug use.   PHYSICAL EXAMINATION:  VITAL SIGNS:  Temperature 97.6, heart rate 81,  respiratory rate 16, blood pressure 115/86, he is 98% O2 saturation on  room air.  GENERAL:  The patient is not in any acute distress.  He is alert and  oriented x3.  He is a thin-appearing male, but does appear healthy.  There is no evidence of any cachectic changes.  He is sitting upright in  his hospital bed.  HEENT:  Scalp, no deformities.  Eyes:  Pupils are equal, round,  reactive.  Extraocular movements are intact.  Oral mucosa is pink.  Trachea is midline.  No cervical lymphadenopathy.  PULMONARY:  Unlabored.  He has full breath sounds bilaterally.  CARDIOVASCULAR:  Regular rate and rhythm.  ABDOMEN:  Positive bowel sounds.  Abdomen is soft, flat, nontender.  He  had a lower midline scar.  No masses.  No hernias are apparent.  EXTREMITIES:  Warm and dry.   PERTINENT LABORATORY AND RADIOGRAPHIC STUDIES:  CBC; white blood cell  count 7.9, hemoglobin 9.9, hematocrit 31.6, platelets 321.  Basic  metabolic panel; sodium 139, potassium 3.6, chloride 110, bicarb 25, BUN  5, creatinine 1.15, blood glucose 80.  Recent colonoscopy did  demonstrate a large mass near the rectosigmoid junction.  A pediatric  scope was unable to be passed by Dr. Jena Gauss.  Biopsies were obtained.  CT  of the abdomen and pelvis demonstrated no evidence of any metastatic  disease or any large sigmoid mass at the rectosigmoid junction.   ASSESSMENT/PLAN:  Rectosigmoid mass.  Based on the finding and the near  obstructive nature, I did discuss with the patient of likely proceeding  with a bowel resection.  Risks, benefits and alternatives of  laparoscopic hand-assisted possible open partial hemicolectomy were  discussed at length with the patient including the possibility of  requiring a colostomy should primary closure be unsafe or not  recommended.  At this  time, we will continue to prepare the patient for  a prep.  As he is currently prepped with bowel prep from his  colonoscopy, we will continue the patient on limited clear liquids at  this time, and we will plan to keep the patient in n.p.o. status  following midnight tonight, for potential proceeding to the operating  room tomorrow.  Cardiac and pulmonary workup will be continued tonight  as well as repeat of blood counts as well as type and crossing the  patient for planned procedure tomorrow.  I will continue to follow this  patient closely and again plan to proceed at earliest convenience to  excise this near obstructing colonic mass.      Tilford Pillar, MD  Electronically Signed     BZ/MEDQ  D:  01/31/2009  T:  02/01/2009  Job:  [id number]   cc:   R. Roetta Sessions, M.D.  [address]  [place]  [place] [address]   Ladona Horns. Mariel Sleet, MD  Fax: [phone]   [facility]   Franchot Heidelberg, M.D.

## 2011-01-08 NOTE — Op Note (Signed)
NAME:  Wayne [name], Wayne [name]              ACCOUNT NO.:  [id number]   MEDICAL RECORD NO.:  [id number]          PATIENT TYPE:  INP   LOCATION:  A320                          FACILITY:  [facility]   PHYSICIAN:  R. Roetta Sessions, M.D. DATE OF BIRTH:  [birth year]   DATE OF PROCEDURE:  01/30/2009  DATE OF DISCHARGE:                               OPERATIVE REPORT   PROCEDURE PERFORMED:  Attempted colonoscopy (flexible sigmoidoscopy with  biopsy).   INDICATIONS FOR PROCEDURE:  A 70 year old gentleman that hospital with  profound microcytic anemia and Hemoccult positive stool.  He has been  transfused and colonoscopy is now being done.  It was notable we  attempted to do a colonoscopy for similar symptoms back in 2008;  however, he was not prepped.  We tried repeatedly to get him back for  colonoscopy but he declined; however, that he is in the hospital.  He is  agreeable to have one.  Risks, benefits, alternatives and limitations  have been discussed, questions answered.  Please see documentation in  the medical record.   PROCEDURE NOTE:  O2 saturation, blood pressure, pulses, respiration  monitored throughout the entire procedure.   CONSCIOUS SEDATION:  Versed 3 mg IV, Demerol 75 mg IV in divided doses.   INSTRUMENT:  Pentax video chip adult colonoscope and pediatric  colonoscope.   FINDINGS:  Digital rectal exam revealed no abnormalities.   ENDOSCOPIC FINDINGS:  The prep was good.  Examination of rectal mucosa  including retroflexed view of the anal verge demonstrated no  abnormalities.  Colon:  The colonic mucosa was surveyed from the rectosigmoid junction a  short distance to 20 cm where there was an exophytic apple core neoplasm  nearly completely obstructing the lumen.  There was a pinpoint residual  lumen at this level.  I was unable to advance the adult scope through  it.  I withdrew and obtained pediatric colonoscope and likewise was not  able to advance even the pediatric scope through  this lesion.  Therefore  the extent of this lesion in his upstream colon was not surveyed today.  Multiple biopsies of this tumor were taken for histologic study.  The  patient tolerated the procedure well and was reacted in endoscopy.   FINAL IMPRESSION:  1. Normal rectum.  2. Nearly obstructing distal sigmoid colorectal cancer (it was      obstructing to passage of the scope) precluding complete      examination.  This lesion was biopsied.   RECOMMENDATIONS:  1. Clear liquid diet.  2. Surgical consultation.      Jonathon Bellows, M.D.  Electronically Signed     RMR/MEDQ  D:  01/30/2009  T:  01/30/2009  Job:  [id number]   cc:   Franchot Heidelberg, M.D.

## 2011-01-08 NOTE — Op Note (Signed)
NAME:  Wayne [name], Wayne [name]              ACCOUNT NO.:  [id number]   MEDICAL RECORD NO.:  [id number]          PATIENT TYPE:  AMB   LOCATION:  DAY                           FACILITY:  [facility]   PHYSICIAN:  R. Roetta Sessions, M.D. DATE OF BIRTH:  [birth year]   DATE OF PROCEDURE:  03/06/2007  DATE OF DISCHARGE:                               OPERATIVE REPORT   PROCEDURE PERFORMED:  EGD with Elease Hashimoto dilation followed by a attempted  colonoscopy.   INDICATIONS FOR PROCEDURE:  The patient is a 70 year old, African-  American male with progressive esophageal dysphagia to solids, weight  loss, and anemia to the point of requiring a transfusion recently.  He  is referred by Dr. Erby Pian.  An EGD and colonoscopy are now being done.  The procedure was discussed with the patient at length.  The risks,  benefits, and alternatives have been reviewed, questions answered, and  he is agreeable.   PROCEDURE NOTE:  02 saturation and blood pressure as well as  respirations were monitored throughout the entirety of the procedure.  Conscious sedation of Versed 4 mg IV and Demerol 100 mg IV in divided  doses, and Cetacaine spray for topical pharyngeal anesthesia.   INSTRUMENT USED:  Pentax video chip system.   FINDINGS:  EGD examination of the tubular esophagus was undertaken.  As  the esophagus was intubated, I ran into a tight, short, benign-  appearing, cervical web, which initially would not admit the scope.  With some pressure, I was able to traverse this area traumatically  dilating the web.  The remainder of the esophagus appeared entirely  normal.  The EG junction was easily traversed entering the stomach and  colon.  The gastric cavity was empty and insufflated well with air.  Thorough examination of the gastric mucosa included a retroflexed view  of the proximal stomach and esophagogastric junction demonstrated a  small hiatal hernia accompanied by antral erosions.  The pylorus was  patent and easily  traversed as well as the second portion that revealed  some bulbar erosions, but no other abnormality was observed.  The scope  was withdrawn, and I passed a #54-French Maloney dilator to full  insertion with ease.  I was going to do a look-back, but the patient  just simply would not open his mouth to accommodate the mouthpiece.  I  elected to not make any further attempts at rechecking his esophagus,  and we turned him around and prepared him for colonoscopy.  Digital  rectal exam revealed no abnormalities or masses on endoscopic findings.  He appeared to be not prepped.  He had large pieces of formed stool in  the rectum and the rectosigmoid pretty much occluding the lumen  precluding examination.  The patient tolerated today's procedures well  and was reacted in Endoscopy.   IMPRESSION:  1. Esophagogastroduodenoscopy:  A critical cervical esophageal web      status post dilation and disruption with passage of the scope and      Maloney dilation; otherwise normal esophagus.  A small hiatal      hernia, antral and bulbar erosions; otherwise  normal stomach, D1-      D2.  2. Colonoscopy:  Attempted and not done for the reasons outlined      above.   RECOMMENDATIONS:  We will regroup and get him rescheduled for  colonoscopy and hopefully in a setting of a good prep.  We will check a  CBC today and see where we stand.  Further recommendations to follow.      Jonathon Bellows, M.D.  Electronically Signed     RMR/MEDQ  D:  03/06/2007  T:  03/06/2007  Job:  [id number]   cc:   Franchot Heidelberg, M.D.

## 2011-01-08 NOTE — Consult Note (Signed)
NAME:  Wayne [name], Wayne [name] NO.:  [id number]   MEDICAL RECORD NO.:  [id number]          PATIENT TYPE:  INP   LOCATION:  A320                          FACILITY:  [facility]   PHYSICIAN:  Ladona Horns. Neijstrom, MD  DATE OF BIRTH:  01-Apr-1941   DATE OF CONSULTATION:  01/31/2009  DATE OF DISCHARGE:                                 CONSULTATION   DIAGNOSES:  1. Severe microcytic anemia consistent with iron deficiency with      guaiac-positive stools and a nearly completely occluding mass 20 cm      above the anal verge in the rectosigmoid junction area consistent      with a malignant neoplasm.  Status post flexible sigmoidoscopy      today.  2. 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  He is unclear as to when this      took place exactly.  3. History of hydroureteronephrosis bilaterally, seen on a CT scan of      Dec 30, 2002 along with this large diverticulum.  4. History of smoking for a few years as a teenager.  5. History of alcohol use for a few years as a teenager only.  6. Poor dental hygiene.  7. Severe iron deficiency with a serum iron less than 10, ferritin      less than 1, and presenting here to this hospital on January 27, 2009      actually with a hemoglobin of 4.4 grams and an MCV of 54,      hematocrit of 15.6%, platelets 423,000, and white count 6000.  He      also had an anemia panel which did not reveal any other      abnormalities.  He has had liver enzymes which are within the      normal range except for a total bilirubin of 4.0 on January 29, 2009.      However, on the date of admission, his bilirubin was only 2.3 and      that indeed will be repeated during this hospitalization.   This is a pleasant 70 year old gentleman who was admitted the other day  with significant weakness, fatigue, swelling of his ankles.  He has had  a little bit of ice craving, but more of a sore tongue lately, who was  found to  be severely anemic consistent with iron deficiency.  Had a heme-  positive stool and he was seen in consultation by Dr. Jena Gauss who did the  flexible sigmoidoscopy today.  CAT scans were being done this evening  and he is in the process of drinking the oral contrast.  He has not been  febrile.  He has lost some weight.  He is not sure how much in the last  year, but it is probably close to 15-20 pounds.  He used to work for  [employer] until he retired at age 36 and that was in 2001.   He has been married 3 times, has a son who is 71 by his second  marriage.  He has been actually married to the same woman twice and then divorced,  and then he married a third time and divorced.  He has been living with  his significant other for about 10 years, he states.   He was born essentially in [place] and lived here all his  life.   He was one of 6 boys and 5 girls born to his parents.  His mother died  in her early 43s from what was felt to be food poisoning after eating  food at some church social and died before she got to the hospital, he  states.  His father never remarried, died in his 65s of possible heart  problems.  He had one brother who was murdered in his 75s in [place].  He has had another brother die of complications of diabetes.   He has lost 2 sisters as well, neither from cancer that he is aware of.   He is not a smoker, presently not a drinker.  He has been retired as I  mentioned.   He is a pleasant gentleman, in no acute distress.  Review of systems  otherwise is negative except for he is having some constipation issues  over the last 6-12 months.  He has seen a little bit of blood in his  stool, primarily on the paper and he does remember that somebody  encouraged him to have colonoscopy sometime about a year or so ago.   He is in no acute distress.  His weight and height are not recorded.  He  is afebrile, though blood pressure is 127/80.  He is not aware of   lymphadenopathy; on exam, he has no lymphadenopathy.  His lungs are  clear at this time.  His heart shows a regular rhythm and rate.  He does  have some flattening of the nails, but not absolutely flat consistent  with iron deficiency.  He has no peripheral edema presently.  His  abdomen is soft and nontender.  I do not feel his liver edge or spleen  tip.  He has no gynecomastia.  He has a midline incision where he has  had the bladder diverticulum removed by Dr. Retta Diones, he states.  Bowel  sounds are slightly hyperactive only.  He has very poor teeth, several  are missing and many of his teeth are rotting and he has gum disease as  well.  His tongue is somewhat smooth without question.  Facial symmetry  appears intact.   This gentleman needs CT scan evaluation and then surgical course  approach to help remove this tumor, which is causing near complete  blockage of his colon and then we need to decide whether he is a  candidate for chemotherapy.      Ladona Horns. Mariel Sleet, MD  Electronically Signed     ESN/MEDQ  D:  01/31/2009  T:  02/01/2009  Job:  [id number]   cc:   Franchot Heidelberg, M.D.   Lawal Garba   R. Roetta Sessions, M.D.  [address]  [place]  [place] [address]

## 2011-01-08 NOTE — H&P (Signed)
NAME:  Wayne [name], Wayne [name]              ACCOUNT NO.:  [id number]   MEDICAL RECORD NO.:  [id number]          PATIENT TYPE:  INP   LOCATION:  A320                          FACILITY:  [facility]   PHYSICIAN:  Lonia Blood, M.D.      DATE OF BIRTH:  1941/03/03   DATE OF ADMISSION:  01/27/2009  DATE OF DISCHARGE:  LH                              HISTORY & PHYSICAL   PRIMARY CARE PHYSICIAN:  Dr. Franchot Heidelberg.   PRESENTING COMPLAINT:  Severe anemia.   HISTORY OF PRESENT ILLNESS:  The patient is a 70 year old gentleman with  history of hypertension and dyslipidemia who apparently has not been  compliant with his medications.  He was sent over as a direct admission  by Dr. Erby Pian after seeing him in the office for follow-up of tooth  abscess.  At that time, the patient was evaluated, and lab work was  performed.  His hemoglobin was found to be 4.7.  The patient has  apparently had chronic anemia for over 2 years.  At that time, he had an  EGD, and a colonoscopy was attempted but not completely done due to poor  prep.  He has been repeatedly reminded that he needs to have to  screening for colon cancer due to his severe anemia.  The patient has  turned it down.  Today, however, he was found to be guaiac-positive in  the office in addition to the low hemoglobin.  Hence, he has been sent  to the hospital for further management.  The patient denied any specific  symptoms except for occasional dizziness and occasional swelling of his  lower extremities.  He denied any visible melena.  No bright blood per  rectum and just wants to go home.  He denied any nausea, vomiting or  diarrhea.  No abdominal pain.   His past medical history is significant for hypertension, dyslipidemia,  GERD, allergic rhinitis, dysphagia.   ALLERGIES:  He has no known drug allergies.   MEDICATIONS:  He is taking no medicines at this point.   PAST SURGICAL HISTORY:  Includes history of resection of bladder tumor  in  2000.   SOCIAL HISTORY:  The patient is divorced.  He never smoked.  Denied any  alcohol, no IV drug use.  He is a retired Location manager.  Currently  lives with friends.  His education is up to 5th grade.   FAMILY HISTORY:  His father died at the age of 10.  He had had diabetes.  His mother died at age of 62, apparently poisoned to death, not sure who  did it.  One of his brothers died at the age of 57, also a diabetic.  One of his sisters died at the age of 68 from coronary artery disease.  She had a CABG.  He has one son, age 40, who is currently healthy.   REVIEW OF SYSTEMS:  GENERAL:  Mainly malaise and fatigue.  No chills,  fever or night sweats.  Denied any exertional dyspnea that is.  RESPIRATORY:  No cough, no shortness of breath.  No sputum.  No  wheezing.  CARDIOVASCULAR:  No chest pain or discomfort, swelling of the  face.  Has some weight gain.  GI:  As per HPI.  GU:  Denied any dysuria,  no frequency.  PSYCH:  Mainly anxious but denied any depression or  suicide ideation.  NEUROLOGICAL:  Denied any focal weakness.  No  numbness, tremors.  ENDOCRINE:  Denied any polyuria, polyphagia,  polydipsia, intolerance.  No weight change.  HEMATOLOGY:  Has anemia.  Otherwise no recent bleed.  The rest of his review of systems is  otherwise per HPI.   PHYSICAL EXAMINATION:  On exam here, his temperature is 98.2, blood  pressure 130/73, pulse 78, respiratory rate 20.  His saturations are  100% room air.  GENERAL: The patient is anxious, awake, alert, oriented in no acute  distress.  HEENT:  PERRL.  EOMI.  NECK:  Is supple.  No JVD, no lymphadenopathy.  RESPIRATORY:  The patient has good air entry bilaterally.  No wheezes or  rales.  CARDIOVASCULAR SYSTEM:  He has S1-S2, no murmur.  ABDOMEN:  Is soft, nontender with positive bowel sounds.  EXTREMITIES:  No edema, cyanosis or clubbing.  RECTAL EXAM:  Performed by Dr. Erby Pian showed 1+ enlarged prostate with  positive guaiac  stools.   His labs here showed a white count of 6.0, hemoglobin 4.4 with an MCV of  54, hematocrit 15.6, platelet count 423, and normal differentials.   ASSESSMENT:  Therefore, this is a 70 year old gentleman presenting with  severe anemia, most likely chronic in nature based on the fact that his  hemoglobin is 4.4 and he is able to walk around.  The patient has had  this going on for at least 2 years.  The differentials are varied, most  likely starting from colon cancer, internal hemorrhoids, diverticular  disease, colonic polyps, or even an upper GI bleed.   PLAN:  1. Severe anemia.  We will do anemia studies, although this is      microcytic anemia and most likely iron deficiency.  Will check      anemia panel, however.  Once we get samples, we will proceed to      type and cross match for at least 4 units of packed red blood      cells.  We will start with 2 units and build up.  The patient will      definitely need to be on iron replacement therapy.  Will get GI      consult for combined EGD colonoscopy.  2. Hypertension.  The patient's blood pressure seems stable at this      point.  No medication, especially in this setting of possible GI      bleed, until his blood pressure is under control.  3. Dyslipidemia.  I will check fasting lipid panel and will make      decision thereafter once the patient is stable regarding treatment      on no treatment.  4. Gastroesophageal reflux disease.  Put him on IV Protonix 40 mg      b.i.d.  5. Allergic rhinitis.  This is stable at this point, and the patient      is on no treatment.  6. History of dysphagia.  The patient apparently is on mechanically      soft diet since 2008 and is doing well with it, we will continue      with his home diet.  7. Tooth abscess.  Again, the patient is stable at  this point.  If      needed, we will put him on some antibiotics, but he needs to have      follow-up with a dentist at some point.   Further  treatment will depend on how the patient does in the hospital  and per GI.      Lonia Blood, M.D.  Electronically Signed     LG/MEDQ  D:  01/28/2009  T:  01/28/2009  Job:  [id number]

## 2011-01-08 NOTE — Consult Note (Signed)
NAME:  Wayne [name], Wayne [name]              ACCOUNT NO.:  [id number]   MEDICAL RECORD NO.:  [id number]          PATIENT TYPE:  INP   LOCATION:  A320                          FACILITY:  [facility]   PHYSICIAN:  R. Roetta Sessions, M.D. DATE OF BIRTH:  July 21, 1941   DATE OF CONSULTATION:  01/28/2009  DATE OF DISCHARGE:                                 CONSULTATION   REFERRING PHYSICIAN:  Hospitalist Team.   REASON FOR CONSULTATION:  Profound microcytic anemia, Hemoccult positive  stool.   HISTORY OF PRESENT ILLNESS:  Mr. Thaddeus Evitts. Crisostomo Caryn Section is a 70 year old  African American male followed primarily by Dr. Erby Pian.  He was seen  in his office recently for an abscessed tooth.   It was discovered that he was profoundly anemic and was hemoccult  positive.  In fact, he was found to have an hemoglobin and hematocrit  here in the hospital yesterday of 4.4-15.6 with an MCV of 54.3, platelet  count 423,000.  He is hemoccult positive and has been transfused.   He has not had any melena or hematochezia.  In fact, he denies any  change in bowel function whatsoever.  He has history of esophageal  dysphagia.  He underwent an EGD with dilation by me in 2008.  We  attempted to do a colonoscopy at that time, however, he was poorly  prepped but he has declined/refused any attempts at bringing him back  for another go at colonoscopy.  He does take aspirin containing headache  powders on the order of once weekly, but denies and nonsteroidal agents.  He does not consume alcohol.   He has never had a colonoscopy.   PAST MEDICAL HISTORY:  Esophageal dysphagia, history of cervical  esophageal web, status post dilation in 2008, by me.   Attempt at colonoscopy in 2008, not successful as outlined above.  At  that point, he was anemic and required transfusion.   PAST MEDICAL HISTORY:  1. Hypertension.  2. Dyslipidemia.  3. Gastroesophageal reflux disease.  4. Allergic rhinitis.  5. Dysphagia.  6. Dental disease.   Prescribed medication at the time of admission      reportedly __________and he does self medicate with BC powders   ALLERGIES:  NO KNOWN DRUG ALLERGIES   FAMILY HISTORY:  Noncontributory.   SOCIAL HISTORY:  The patient is divorced.  No tobacco, no alcohol or  illicit drugs.   No history of chronic GI or liver illness.   REVIEW OF SYSTEMS:  He denies weight loss, fever or fatigue.  He does  have occasional esophageal dysphagia to solid food and does well with  most foods, however.   PHYSICAL EXAMINATION:  GENERAL:  A somewhat disheveled 70-year gentleman  resting comfortably.  VITAL SIGNS:  Temperature 98.1, pulse 70, respirations 18, BP 122/83.  SKIN:  Warm and dry.  No scleral icterus.  Conjunctivae are pink.  CHEST:  Lungs are clear to auscultation.  HEART:  Regular rate and rhythm with a 3/6 systolic ejection murmur.  ABDOMEN:  Flat, positive bowel sounds, soft, nontender.  No obvious mass  or organomegaly.  EXTREMITIES:  No edema.  RECTAL:  Deferred until the time of colonoscopy.   IMPRESSION:  Mr. Jarelle Ates is a pleasant 70 year old gentleman with  chronic anemia, now presenting with a profound microcytic anemia and  Hemoccult positive stool.  He has declined workup for some time now.  I  am afraid the differential would include an occult colorectal cancer  which would be quite high on the differential list with a multitude of  other, less likely entity is also possible.   He does take regular aspirin powders.  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  He does have some occasional  dysphagia, but overall has had a significant sustained improvement since  he was dilated back in 2008.   RECOMMENDATIONS:  1. Agree with transfusion.  2. He needs a colonoscopy and a I discussed the risks, benefits,      alternatives and limitations with him.  I told him he really needs      to have a colonoscopy.  His questions were answered, and he is       agreeable.  We will plan for a colonoscopy on January 30, 2009.  If      colonoscopy turns out to be unrevealing, then I would proceed with      an EGD.  3. We will add Protonix to his regimen empirically until we have the      etiology of this bleeding sorted out.   I would thank the hospitalist team for allowing me to see this nice  gentleman once again.      Jonathon Bellows, M.D.  Electronically Signed     RMR/MEDQ  D:  01/28/2009  T:  01/28/2009  Job:  [id number]   cc:   Franchot Heidelberg, M.D.   Hospitalist

## 2011-01-11 NOTE — Op Note (Signed)
NAME:  Wayne [name], Wayne [name]                        ACCOUNT NO.:  [id number]   MEDICAL RECORD NO.:  [id number]                   PATIENT TYPE:  INP   LOCATION:  0375                                 FACILITY:  [facility]   PHYSICIAN:  Bertram Millard. Dahlstedt, M.D.          DATE OF BIRTH:  [birth year]   DATE OF PROCEDURE:  03/07/2003  DATE OF DISCHARGE:                                 OPERATIVE REPORT   PREOPERATIVE DIAGNOSES:  1. Large bladder diverticulum.  2. Bladder outlet obstruction secondary to benign prostatic hyperplasia.   POSTOPERATIVE DIAGNOSES:  1. Large bladder diverticulum.  2. Bladder outlet obstruction secondary to benign prostatic hyperplasia.   PROCEDURES:  1. Excision of bladder diverticulectomy.  2. Suprapubic tube placement.  3. Cystoscopy.  4..  Transurethral incision of prostate.   INDICATION FOR PROCEDURE:  Mr. Rodeheaver is a 70 year old, African-American  male, with chronic renal insufficiency who was recently found to have a  worsening creatinine and bilateral hydronephrosis.  He had a CT scan which  demonstrated a thickened bladder wall and a large bladder diverticulum.  Foley catheter was placed, and the patient's creatinine subsequently came  down to a level of 1.7.  It was felt that his bladder diverticulum was  secondary to bladder outlet obstruction from benign prostate disease.  He  consented to have this large diverticulum removed after understanding the  risks and the benefits.  He also consented to transurethral incision of the  prostate versus transurethral resection of the prostate gland.   DESCRIPTION OF PROCEDURE:  The patient is brought to the operating room and  correctly identified by his identification bracelet.  He was given  preoperative antibiotics and general endotracheal anesthesia.  He is placed  in dorsal lithotomy position and prepped and draped in typical sterile  fashion.  His indwelling Foley catheter was removed, and a new catheter  was  placed and irrigated copiously with antibiotic solution.  A midline incision  was made with a skin knife electrocautery.  Blunt dissection was used to  expose the linea alba which was incised with electrocautery.  The two  bellies of the rectus abdominus muscles were identified and were retracted  laterally, exposing transversalis fascia which was incised with cautery.  The bladder was easily seen at this point, and blunt dissection was done  around the bladder, freeing up on the right side.  The diverticulum was  palpable on the right side with the bladder full.  Two stay stitches of 2-0  Vicryl were placed in the midline in the bladder, and the bladder was opened  with electrocautery.  A 5 French ureteral catheter was passed into the right  ureteral orifice without difficulty.  The bladder diverticulum could be seen  medially to the right ureteral orifice, and a Ray-Tec was placed through the  neck which was somewhat tight into the bladder diverticulum.  Sharp  dissection and electrocautery were used to free up this diverticulum  on the  right lateral bladder wall.  This was done with care taken not to involve  the ureter in this dissection.  Once the bladder diverticulum was freed up,  it was removed at its neck with electrocautery.  The specimen was sent to  pathology.  Prior to closure of the fascia, a suprapubic tube and Blake  drain were placed.  The suprapubic tube was placed to make a small incision  in the bladder and the fascia and running a 24 French catheter through the  skin and fascia into the bladder, and a 30 mL balloon was inflated.  The  Central drain was placed on the opposite side without difficulty.  The  diverticulum defect was then closed in two layers with 2-0 Vicryl.  The  ureter could be palpated with the ureteral stent in place and was not  involved in this closure.  The bladder defect was then closed with two  layers of 2-0 chromic.  No obvious leakage was  ascertained at this point.  All sites of bleeding were carefully coagulated.  The fascia was then closed  with a 0 PDS.  The skin was stapled.  During the surgery, the bladder neck  was palpable inside the bladder, and a very tight bladder neck was  appreciated.  We felt this would be amenable to transurethral incision of  prostate.  Next, cystoscopy was performed, first by placing the 26 French  resectoscope sheath with the obturator.  This passed easily into the  bladder, and the [facility] knife was inserted.  A midline incision was made at  6 o'clock which markedly opened up the bladder neck and prostatic urethra.  On inspection when the scope was first placed, there did not appear to be  obstructing lateral or median lobes.  After the incision at the bladder neck  was made in the prostatic urethra, there was a nice open channel that did  not appear to be obstructing in any way.  Cystoscope was removed, and a 24  French urethral catheter was placed to straight drain.  After the Foley  urethral catheter was placed, the patient was allowed to awaken and did so  without difficulty.  Please note that Bertram Millard. Dahlstedt, M.D. was present  and participated throughout this case.  There were no complications.  The  patient was taken to the postanesthesia care unit in stable condition.     Susanne Borders, MD                           Bertram Millard. Dahlstedt, M.D.    DR/MEDQ  D:  03/07/2003  T:  03/07/2003  Job:  [id number]

## 2011-01-11 NOTE — Discharge Summary (Signed)
   NAME:  Wayne [name], Wayne [name]                        ACCOUNT NO.:  [id number]   MEDICAL RECORD NO.:  [id number]                   PATIENT TYPE:  INP   LOCATION:  0375                                 FACILITY:  [facility]   PHYSICIAN:  Bertram Millard. Dahlstedt, M.D.          DATE OF BIRTH:  [birth year]   DATE OF ADMISSION:  03/07/2003  DATE OF DISCHARGE:  03/11/2003                                 DISCHARGE SUMMARY   PRIMARY DIAGNOSIS:  Bladder outlet obstruction.   SECONDARY DIAGNOSES:  1. Large bladder diverticulum.  2. Hydronephrosis.  3. Hypertension.   PRINCIPAL PROCEDURE:  Open excision of bladder diverticulum, transurethral  incision of prostate.   BRIEF HISTORY:  This 70 year old gentleman has a history of bladder neck  obstruction due to BPH.  He has a large bladder diverticulum and a very  thick wall bladder.  The patient is hydronephrotic which resolved with  adequate catheter drainage.  He presents at this time for surgical  management of his bladder outlet obstruction and excision of his bladder  diverticulum.   HOSPITAL COURSE:  The patient was admitted directly to the operating room  and underwent open excision of his bladder diverticulum and TUIP.   The patient tolerated these procedures well.  His postoperative course was  unremarkable.  He was left with a Foley catheter at his urethra and a  suprapubic tube.  His drain was removed at an appropriate time.  He was  advanced to a regular diet after some mild problems with nausea and  vomiting.  His pain remained well controlled.  His incisions healed well.  He was discharged home with home health care followup on the 16th of July.   DISCHARGE MEDICATIONS:  1. Norvasc.  2. Atenolol.  3. Hydrochlorothiazide.  4. Vicodin.  5. Colace.  6. He was told to take sulfa/__________ one a day.   FOLLOWUP:  He will follow up in my office in one week.   ACTIVITY:  Activity restrictions were discussed.                       Bertram Millard. Dahlstedt, M.D.    SMD/MEDQ  D:  04/07/2003  T:  04/07/2003  Job:  [id number]

## 2011-01-11 NOTE — H&P (Signed)
NAME:  Wayne [name], Wayne [name]                        ACCOUNT NO.:  [id number]   MEDICAL RECORD NO.:  [id number]                   PATIENT TYPE:  INP   LOCATION:  0375                                 FACILITY:  [facility]   PHYSICIAN:  Bertram Millard. Dahlstedt, M.D.          DATE OF BIRTH:  [birth year]   DATE OF ADMISSION:  03/07/2003  DATE OF DISCHARGE:  03/11/2003                                HISTORY & PHYSICAL   REASON FOR ADMISSION:  Inability to urinate.   BRIEF HISTORY:  A 70 year old male who presented with large bladder  diverticulum and hydronephrosis.  He was found to have significant bladder  outlet obstruction, probably a neurogenic bladder and a large bladder  diverticulum that was actually a  size larger than his bladder capacity.  His hydronephrosis resolved with bladder drainage.  He failed voiding  trials.  It was felt that the patient should undergo a TURP versus a TUIP as  well as excision of his diverticulum.  This was discussed with the patient  at length and he presents at this time for that procedure.   PAST MEDICAL HISTORY:  Hypertension.   PAST SURGICAL HISTORY:  Noncontributory except for repair of a chainsaw  injury to his left arm years ago.   MEDICATIONS:  1. Norvasc 5 mg daily.  2. Hydrochlorothiazide 25 mg daily.  3. Atenolol 50 mg daily.   ALLERGIES:  He has no known drug allergies.   SOCIAL HISTORY:  The patient lives with his girlfriend.  He is not married.  He is retired.  He currently does not use tobacco or alcohol.   REVIEW OF SYSTEMS:  Noncontributory.   FAMILY HISTORY:  Noncontributory.   PHYSICAL EXAMINATION:  GENERAL:  Pleasant, but somewhat slow adult male.  VITAL SIGNS:  Blood pressure 160/90, pulse 50, respiratory rate 14,  temperature 98.3.  EKG revealed sinus bradycardia.  Chest x-ray revealed no  acute changes.  CBC was normal except for hemoglobin of 9.3 and hematocrit  of 27.7%.  BUN and creatinine were 26 and 2.6, respectively.  HEENT:  Poor dentition.  NECK:  Supple.  CHEST:  Clear.  HEART:  Regular rate and rhythm.  ABDOMEN:  Unremarkable.  EXTREMITIES:  Entirely normal.  DP and PT pulses were normal bilaterally.  RECTAL:  Normal sphincter tone.  Prostate 2+ without tenderness, no  nodularity.  No rectal masses.  Seminal vesicles were not palpable.   IMPRESSION:  1. Bilateral hydronephrosis, resolved.  2. Renal insufficiency probably due to long-standing obstruction.  3. Bladder outlet obstruction/benign prostatic hypertrophy.  4. Large bladder diverticulum.  5. Hypertension.   PLAN:  Admit for excision of bladder diverticulum, TUIP.                                               Bertram Millard.  Dahlstedt, M.D.    SMD/MEDQ  D:  04/07/2003  T:  04/07/2003  Job:  [id number]

## 2011-06-11 LAB — CBC
Hemoglobin: 8.1 — ABNORMAL LOW
RBC: 4.15 — ABNORMAL LOW
WBC: 5.2

## 2011-06-12 LAB — ABO/RH: ABO/RH(D): O NEG

## 2011-06-12 LAB — CROSSMATCH
ABO/RH(D): O NEG
Antibody Screen: NEGATIVE

## 2011-06-12 LAB — HEMOGLOBIN AND HEMATOCRIT, BLOOD
HCT: 22.1 — ABNORMAL LOW
Hemoglobin: 6.6 — CL

## 2017-11-28 ENCOUNTER — Emergency Department (HOSPITAL_COMMUNITY)
Admission: EM | Admit: 2017-11-28 | Discharge: 2017-11-28 | Disposition: A | Discharge status: Home / Self Care | Payer: Medicare Other | Attending: Emergency Medicine | Admitting: Emergency Medicine

## 2017-11-28 ENCOUNTER — Emergency Department (HOSPITAL_COMMUNITY): Payer: Medicare Other

## 2017-11-28 ENCOUNTER — Other Ambulatory Visit: Payer: Self-pay

## 2017-11-28 ENCOUNTER — Encounter (HOSPITAL_COMMUNITY): Payer: Self-pay

## 2017-11-28 DIAGNOSIS — I10 Essential (primary) hypertension: Secondary | ICD-10-CM | POA: Diagnosis not present

## 2017-11-28 DIAGNOSIS — R112 Nausea with vomiting, unspecified: Secondary | ICD-10-CM

## 2017-11-28 DIAGNOSIS — R1111 Vomiting without nausea: Secondary | ICD-10-CM | POA: Diagnosis not present

## 2017-11-28 DIAGNOSIS — Z79899 Other long term (current) drug therapy: Secondary | ICD-10-CM | POA: Insufficient documentation

## 2017-11-28 DIAGNOSIS — R05 Cough: Secondary | ICD-10-CM | POA: Diagnosis not present

## 2017-11-28 DIAGNOSIS — R197 Diarrhea, unspecified: Secondary | ICD-10-CM | POA: Diagnosis not present

## 2017-11-28 DIAGNOSIS — R1011 Right upper quadrant pain: Secondary | ICD-10-CM | POA: Diagnosis not present

## 2017-11-28 DIAGNOSIS — K8020 Calculus of gallbladder without cholecystitis without obstruction: Secondary | ICD-10-CM | POA: Diagnosis not present

## 2017-11-28 DIAGNOSIS — R1110 Vomiting, unspecified: Secondary | ICD-10-CM | POA: Diagnosis not present

## 2017-11-28 LAB — COMPREHENSIVE METABOLIC PANEL
ALBUMIN: 4.2 g/dL (ref 3.5–5.0)
ALT: 16 U/L — AB (ref 17–63)
AST: 21 U/L (ref 15–41)
Alkaline Phosphatase: 80 U/L (ref 38–126)
Anion gap: 11 (ref 5–15)
BILIRUBIN TOTAL: 1.6 mg/dL — AB (ref 0.3–1.2)
BUN: 11 mg/dL (ref 6–20)
CALCIUM: 9.8 mg/dL (ref 8.9–10.3)
CO2: 27 mmol/L (ref 22–32)
Chloride: 97 mmol/L — ABNORMAL LOW (ref 101–111)
Creatinine, Ser: 1.21 mg/dL (ref 0.61–1.24)
GFR calc Af Amer: >60 mL/min (ref >60)
GFR, EST NON AFRICAN AMERICAN: 56 mL/min — AB (ref >60)
GLUCOSE: 146 mg/dL — AB (ref 65–99)
Potassium: 3.5 mmol/L (ref 3.5–5.1)
Sodium: 135 mmol/L (ref 135–145)
TOTAL PROTEIN: 7.6 g/dL (ref 6.5–8.1)

## 2017-11-28 LAB — LIPASE, BLOOD: Lipase: 35 U/L (ref 11–51)

## 2017-11-28 LAB — CBC
HCT: 46.4 % (ref 39.0–52.0)
Hemoglobin: 15.8 g/dL (ref 13.0–17.0)
MCH: 27.7 pg (ref 26.0–34.0)
MCHC: 34.1 g/dL (ref 30.0–36.0)
MCV: 81.3 fL (ref 78.0–100.0)
Platelets: 229 10*3/uL (ref 150–400)
RBC: 5.71 MIL/uL (ref 4.22–5.81)
RDW: 14.3 % (ref 11.5–15.5)
WBC: 8.3 10*3/uL (ref 4.0–10.5)

## 2017-11-28 LAB — URINALYSIS, ROUTINE W REFLEX MICROSCOPIC
BACTERIA UA: NONE SEEN
BILIRUBIN URINE: NEGATIVE
Glucose, UA: 150 mg/dL — AB
Ketones, ur: 5 mg/dL — AB
LEUKOCYTES UA: NEGATIVE
NITRITE: NEGATIVE
PH: 7 (ref 5.0–8.0)
Protein, ur: 30 mg/dL — AB
SPECIFIC GRAVITY, URINE: 1.011 (ref 1.005–1.030)
Squamous Epithelial / LPF: NONE SEEN

## 2017-11-28 MED ORDER — LISINOPRIL 10 MG PO TABS: 10.0000 mg | ORAL_TABLET | Freq: Every day | ORAL | 0 refills | Status: AC

## 2017-11-28 MED ORDER — CLONIDINE HCL 0.1 MG PO TABS
0.1000 mg | ORAL_TABLET | Freq: Once | ORAL | Status: AC
  Administered 2017-11-28: 0.1 mg via ORAL
  Filled 2017-11-28: qty 1

## 2017-11-28 MED ORDER — ONDANSETRON 4 MG PO TBDP: ORAL_TABLET | ORAL | 0 refills | Status: AC

## 2017-11-28 MED ORDER — ONDANSETRON HCL 4 MG/2ML IJ SOLN
4.0000 mg | Freq: Once | INTRAMUSCULAR | Status: AC
  Administered 2017-11-28: 4 mg via INTRAVENOUS
  Filled 2017-11-28: qty 2

## 2017-11-28 MED ORDER — SODIUM CHLORIDE 0.9 % IV BOLUS
1000.0000 mL | Freq: Once | INTRAVENOUS | Status: AC
  Administered 2017-11-28: 1000 mL via INTRAVENOUS

## 2017-11-28 NOTE — ED Provider Notes (Signed)
[place] DEPT Provider Note   CSN: [id number] Arrival date & time: 11/28/17  1556     History   Chief Complaint Chief Complaint  Patient presents with  . Hypertension  . Emesis    HPI ISACC TURNEY is a 77 y.o. male.  Patient is a 77 year old male with history of colon cancer who presents with nausea vomiting diarrhea.  He states last night he started having some diarrhea which he describes as watery, nonbloody stools.  This morning he started having some vomiting.  He has had several episodes of nonbloody, nonbilious emesis.  He denies any abdominal pain.  No fevers.  He has had a mild cough but no noticeable chest congestion.  No known sick contacts.  He went to [facility] urgent care where he was told his blood pressure was markedly elevated.  He states he has not had his blood pressure checked in several years.  He does have a reported history based on the [facility] notes of hypertension, hyperlipidemia, chronic kidney disease and diabetes.  Patient does not acknowledge that he has these conditions.  He states he has not been to a doctor in about 15 years.  He denies any headache.  No numbness or weakness to his extremities.  No difficulty ambulating.  No vision changes.  No difficulty with urination.     History reviewed. No pertinent past medical history.  Patient Active Problem List   Diagnosis Date Noted  . ABSCESS, TOOTH 12/08/2008  . MICROCYTIC ANEMIA 02/09/2007  . NEOP, NOS, BLADDER 02/02/2007  . HYPERLIPIDEMIA 02/02/2007  . HYPERTENSION 02/02/2007  . ALLERGIC RHINITIS 02/02/2007  . GERD 02/02/2007  . ARTHRITIS 02/02/2007  . DYSPHAGIA, UNSPECIFIED 02/02/2007     The histories are not reviewed yet. Please review them in the "History" navigator section and refresh this [place].      Home Medications    Prior to Admission medications   Medication Sig Start Date End Date Taking? Authorizing Provider  calcium carbonate (TUMS - DOSED  IN MG ELEMENTAL CALCIUM) 500 MG chewable tablet Chew 1 tablet by mouth 2 (two) times daily.   Yes [provider]  magnesium citrate SOLN Take 1 Bottle by mouth daily.   Yes [provider]  SENNA PO Take 1 tablet by mouth daily.   Yes [provider]  lisinopril (PRINIVIL,ZESTRIL) 10 MG tablet Take 1 tablet (10 mg total) by mouth daily. 11/28/17   Malvin Johns, MD  ondansetron (ZOFRAN ODT) 4 MG disintegrating tablet 4mg  ODT q4 hours prn nausea/vomit 11/28/17   Malvin Johns, MD    Family History History reviewed. No pertinent family history.  Social History Social History   Tobacco Use  . Smoking status: Not on file  Substance Use Topics  . Alcohol use: Not on file  . Drug use: Not on file     Allergies   Patient has no known allergies.   Review of Systems Review of Systems  Constitutional: Negative for chills, diaphoresis, fatigue and fever.  HENT: Negative for congestion, rhinorrhea and sneezing.   Eyes: Negative.   Respiratory: Negative for cough, chest tightness and shortness of breath.   Cardiovascular: Negative for chest pain and leg swelling.  Gastrointestinal: Positive for diarrhea, nausea and vomiting. Negative for abdominal pain and blood in stool.  Genitourinary: Negative for difficulty urinating, flank pain, frequency and hematuria.  Musculoskeletal: Negative for arthralgias and back pain.  Skin: Negative for rash.  Neurological: Negative for dizziness, speech difficulty, weakness, numbness and  headaches.     Physical Exam Updated Vital Signs BP (!) 161/94   Pulse 65   Temp 98.6 F (37 C) (Oral)   Resp (!) 24   Ht 5\' 7"  (1.702 m)   Wt 68.9 kg (152 lb)   SpO2 95%   BMI 23.81 kg/m   Physical Exam  Constitutional: He is oriented to person, place, and time. He appears well-developed and well-nourished.  HENT:  Head: Normocephalic and atraumatic.  Eyes: Pupils are equal, round, and reactive to light.  Neck: Normal range of  motion. Neck supple.  Cardiovascular: Normal rate, regular rhythm and normal heart sounds.  Pulmonary/Chest: Effort normal and breath sounds normal. No respiratory distress. He has no wheezes. He has no rales. He exhibits no tenderness.  Abdominal: Soft. Bowel sounds are normal. There is no tenderness. There is no rebound and no guarding.  Musculoskeletal: Normal range of motion. He exhibits no edema.  Lymphadenopathy:    He has no cervical adenopathy.  Neurological: He is alert and oriented to person, place, and time.  Motor 5/5 all extremities Sensation grossly intact to LT all extremities Finger to Nose intact, no pronator drift CN II-XII grossly intact   Skin: Skin is warm and dry. No rash noted.  Psychiatric: He has a normal mood and affect.     ED Treatments / Results  Labs (all labs ordered are listed, but only abnormal results are displayed) Labs Reviewed  COMPREHENSIVE METABOLIC PANEL - Abnormal; Notable for the following components:      Result Value   Chloride 97 (*)    Glucose, Bld 146 (*)    ALT 16 (*)    Total Bilirubin 1.6 (*)    GFR calc non Af Amer 56 (*)    All other components within normal limits  URINALYSIS, ROUTINE W REFLEX MICROSCOPIC - Abnormal; Notable for the following components:   Glucose, UA 150 (*)    Hgb urine dipstick MODERATE (*)    Ketones, ur 5 (*)    Protein, ur 30 (*)    All other components within normal limits  LIPASE, BLOOD  CBC    EKG EKG Interpretation  Date/Time:  Friday November 28 2017 18:10:20 EDT Ventricular Rate:  79 PR Interval:    QRS Duration: 87 QT Interval:  391 QTC Calculation: 449 R Axis:   -47 Text Interpretation:  Sinus rhythm Left anterior fascicular block LVH by voltage Borderline T abnormalities, lateral leads mild ST elevation anteriorly, likely realy repol changes/LVH Confirmed by Malvin Johns [phone]) on 11/28/2017 6:22:40 PM   Radiology Dg Abd Acute W/chest  Result Date: 11/28/2017 CLINICAL DATA:  Per  order- cough and vomiting Pt reports sudden onset of N/V/D this morning. Pt states he also has a dry cough today.PT HX: non smoker EXAM: DG ABDOMEN ACUTE W/ 1V CHEST COMPARISON:  01/31/2009 FINDINGS: There is no bowel dilation to suggest obstruction or generalized adynamic ileus. No air-fluid levels. No free air. There is a bowel anastomosis staple line in the central pelvis. Two calcifications are noted in the right upper quadrant consistent with gallstones. No evidence of renal or ureteral stones. Soft tissues are otherwise unremarkable. Heart is top-normal in size. No mediastinal or hilar masses. Clear lungs. No acute skeletal abnormality. IMPRESSION: 1. No acute findings. No evidence of bowel obstruction or generalized adynamic ileus. No free air. 2. Gallstones. 3. No active cardiopulmonary disease. Electronically Signed   By: Lajean Manes M.D.   On: 11/28/2017 18:03   Ct Renal Stone  Study  Result Date: 11/28/2017 CLINICAL DATA:  Sudden onset nausea, vomiting, and diarrhea this morning. Hypertension. Flank pain. History of colon cancer. EXAM: CT ABDOMEN AND PELVIS WITHOUT CONTRAST TECHNIQUE: Multidetector CT imaging of the abdomen and pelvis was performed following the standard protocol without IV contrast. COMPARISON:  01/31/2009 FINDINGS: Lower chest: Atelectasis in the lung bases. Coronary artery calcifications. Small esophageal hiatal hernia. Hepatobiliary: Cholelithiasis without inflammatory change. No focal liver lesions on noncontrast imaging. Bile ducts are not dilated. Pancreas: Unremarkable. No pancreatic ductal dilatation or surrounding inflammatory changes. Spleen: Normal in size without focal abnormality. Adrenals/Urinary Tract: No adrenal gland nodules. Bilateral hydronephrosis and hydroureter. No obstructing stones are demonstrated. This appearance may be due to reflux or urine stasis and is similar to the previous study. Bladder wall is diffusely thickened suggesting cystitis or neurogenic  bladder. This also is similar to previous study. No bladder stone or filling defect. Stomach/Bowel: Stomach, small bowel, and colon are not abnormally distended. No wall thickening is appreciated. Diverticulosis of the colon without evidence of diverticulitis. Sigmoid colon anastomosis. Appendix is normal. Vascular/Lymphatic: Aortic atherosclerosis. No enlarged abdominal or pelvic lymph nodes. Reproductive: Prostate gland is enlarged, measuring 5 cm diameter. Other: No free air or free fluid in the abdomen. Scarring in the anterior abdominal wall consistent with postoperative change. Small anterior abdominal wall hernias, some containing fat and some with small bowel. No proximal obstruction. Musculoskeletal: Spondylolysis with mild spondylolisthesis at L5-S1. Degenerative changes in the spine. No destructive bone lesions. IMPRESSION: 1. Bilateral renal hydronephrosis and hydroureter without obstructing stone or lesion identified. This may be due to reflux or urine stasis and is similar to the previous study. 2. Bladder wall is diffusely thickened suggesting cystitis or hypertrophic change. This also is similar to the previous study. 3. Cholelithiasis without evidence of cholecystitis. 4. Small anterior abdominal wall hernias some with fat and some with bowel but without evidence of proximal obstruction. 5. Aortic atherosclerosis. 6. Enlarged prostate gland. 7. Small esophageal hiatal hernia. Electronically Signed   By: Lucienne Capers M.D.   On: 11/28/2017 23:16   US Abdomen Limited Ruq  Result Date: 11/28/2017 CLINICAL DATA:  Nausea, vomiting and diarrhea EXAM: ULTRASOUND ABDOMEN LIMITED RIGHT UPPER QUADRANT COMPARISON:  Plain radiographs from 11/28/2017 FINDINGS: Gallbladder: A shadowing gallstone measuring 9.1 mm is identified at the neck of the gallbladder. No gallbladder wall thickening, pericholecystic fluid nor gallbladder distention however is noted. Single wall thickness of the gallbladder is within  normal limits at 2.3 mm. No sonographic Murphy sign noted by sonographer. Common bile duct: Diameter: 5.8 mm proximally and within normal limits. Liver: No focal lesion identified. Within normal limits in parenchymal echogenicity. Portal vein is patent on color Doppler imaging with normal direction of blood flow towards the liver. Other: Moderate to marked dilatation of the included right renal collecting system consistent with hydronephrosis. The ureter could not be visualized further on this study and therefore CT is recommended for better correlation. IMPRESSION: 1. Uncomplicated cholelithiasis. 2. Moderate to marked dilatation of the right intrarenal collecting system and renal pelvis suspicious for hydronephrosis or caliectasis. CT is recommended of the abdomen and pelvis to exclude a distal ureteral stone or other pathology accounting for this appearance. On the earlier same day radiographs of the abdomen and pelvis there are calcifications in the pelvis more likely representing phleboliths. Electronically Signed   By: Ashley Royalty M.D.   On: 11/28/2017 19:40    Procedures Procedures (including critical care time)  Medications Ordered in ED Medications  cloNIDine (CATAPRES) tablet 0.1 mg (0.1 mg Oral Given 11/28/17 1728)  ondansetron (ZOFRAN) injection 4 mg (4 mg Intravenous Given 11/28/17 1728)  sodium chloride 0.9 % bolus 1,000 mL (0 mLs Intravenous Stopped 11/28/17 1915)  cloNIDine (CATAPRES) tablet 0.1 mg (0.1 mg Oral Given 11/28/17 2025)     Initial Impression / Assessment and Plan / ED Course  I have reviewed the triage vital signs and the nursing notes.  Pertinent labs & imaging results that were available during my care of the patient were reviewed by me and considered in my medical decision making (see chart for details).     Patient presents with nausea vomiting diarrhea.  His labs are non-concerning.  His blood sugars a little bit elevated.  His urinalysis has some hematuria but no  signs of infection.  Chest x-ray is clear without evidence of pneumonia.  X-ray of his abdomen showed some gallstones.  Ultrasound was done which showed no evidence of cholecystitis.  However on ultrasound there was noted to be some dilatation of the ureters and hydronephrosis.  Therefore a CT scan of his abdomen pelvis was performed which shows no kidney stones.  He does have some bilateral hydronephrosis which is similar to findings on a prior CT scan.  Patient's blood pressure was elevated on arrival.  I suspect he has had long-standing hypertension but he has not had his blood pressure checked in many years.  He is asymptomatic from this.  His EKG does not show any ischemic changes.  His kidney function is normal.  He was given clonidine orally in the emergency department and his blood pressure has improved.  I will start him on lisinopril.  He was given a prescription for this.  He and his wife are explained possible side effects.  His wife is going to get him established with a [facility] which is where he was seen today.  He was encouraged to have his blood pressure rechecked as well as his blood glucose and a recheck on his urine to make sure the blood clears.  Return precautions were given.  Final Clinical Impressions(s) / ED Diagnoses   Final diagnoses:  RUQ pain  Nausea vomiting and diarrhea  Essential hypertension    ED Discharge Orders        Ordered    lisinopril (PRINIVIL,ZESTRIL) 10 MG tablet  Daily     11/28/17 2322    ondansetron (ZOFRAN ODT) 4 MG disintegrating tablet     11/28/17 2322       Malvin Johns, MD 11/28/17 2332

## 2017-11-28 NOTE — ED Notes (Signed)
From [facility] in [facility] sending patient over for N/V/D-patient has a history of colon cancer

## 2017-11-28 NOTE — Discharge Instructions (Signed)
Your blood sugar was elevated.  This needs to be rechecked along with your blood pressure by primary care physician.  He also did have some blood in your urine which needs to be rechecked by her primary care physician.  Return here if you have any ongoing vomiting, worsening abdominal pain or other worsening symptoms.

## 2017-11-28 NOTE — ED Triage Notes (Signed)
Pt reports sudden onset of N/V/D this morning. Pt also arrives hypertensive. He and his wife report that he has not seen a doctor in about 15 years and are unsure of his full medical hx. A&Ox4. Ambulatory.

## 2017-11-28 NOTE — ED Notes (Signed)
Patient transported to x-ray. ?

## 2017-12-03 ENCOUNTER — Encounter (HOSPITAL_COMMUNITY): Payer: Self-pay | Admitting: Emergency Medicine

## 2017-12-03 ENCOUNTER — Emergency Department (HOSPITAL_COMMUNITY)
Admission: EM | Admit: 2017-12-03 | Discharge: 2017-12-03 | Disposition: A | Discharge status: Home / Self Care | Payer: Medicare Other | Attending: Emergency Medicine | Admitting: Emergency Medicine

## 2017-12-03 DIAGNOSIS — I10 Essential (primary) hypertension: Secondary | ICD-10-CM

## 2017-12-03 DIAGNOSIS — Z79899 Other long term (current) drug therapy: Secondary | ICD-10-CM | POA: Insufficient documentation

## 2017-12-03 DIAGNOSIS — E876 Hypokalemia: Secondary | ICD-10-CM

## 2017-12-03 HISTORY — DX: Essential (primary) hypertension: I10

## 2017-12-03 LAB — BASIC METABOLIC PANEL
Anion gap: 11 (ref 5–15)
BUN: 18 mg/dL (ref 6–20)
CHLORIDE: 99 mmol/L — AB (ref 101–111)
CO2: 26 mmol/L (ref 22–32)
CREATININE: 1.15 mg/dL (ref 0.61–1.24)
Calcium: 8.6 mg/dL — ABNORMAL LOW (ref 8.9–10.3)
GFR calc non Af Amer: 59 mL/min — ABNORMAL LOW (ref >60)
Glucose, Bld: 101 mg/dL — ABNORMAL HIGH (ref 65–99)
Potassium: 2.9 mmol/L — ABNORMAL LOW (ref 3.5–5.1)
SODIUM: 136 mmol/L (ref 135–145)

## 2017-12-03 LAB — I-STAT CHEM 8, ED
BUN: 17 mg/dL (ref 6–20)
CALCIUM ION: 1.05 mmol/L — AB (ref 1.15–1.40)
CHLORIDE: 97 mmol/L — AB (ref 101–111)
Creatinine, Ser: 1.2 mg/dL (ref 0.61–1.24)
Glucose, Bld: 99 mg/dL (ref 65–99)
HCT: 45 % (ref 39.0–52.0)
Hemoglobin: 15.3 g/dL (ref 13.0–17.0)
POTASSIUM: 2.8 mmol/L — AB (ref 3.5–5.1)
SODIUM: 136 mmol/L (ref 135–145)
TCO2: 26 mmol/L (ref 22–32)

## 2017-12-03 MED ORDER — POTASSIUM CHLORIDE ER 10 MEQ PO TBCR
20.0000 meq | EXTENDED_RELEASE_TABLET | Freq: Two times a day (BID) | ORAL | 0 refills | Status: AC
Start: 2017-12-03 — End: 2017-12-10

## 2017-12-03 MED ORDER — HYDROCHLOROTHIAZIDE 12.5 MG PO CAPS
25.0000 mg | ORAL_CAPSULE | Freq: Once | ORAL | Status: AC
  Administered 2017-12-03: 25 mg via ORAL
  Filled 2017-12-03: qty 2

## 2017-12-03 MED ORDER — HYDROCHLOROTHIAZIDE 25 MG PO TABS: 25.0000 mg | ORAL_TABLET | Freq: Every day | ORAL | 0 refills | Status: AC

## 2017-12-03 MED ORDER — POTASSIUM CHLORIDE CRYS ER 20 MEQ PO TBCR
40.0000 meq | EXTENDED_RELEASE_TABLET | Freq: Once | ORAL | Status: AC
Start: 2017-12-03 — End: 2017-12-03
  Administered 2017-12-03: 40 meq via ORAL
  Filled 2017-12-03: qty 2

## 2017-12-03 NOTE — ED Provider Notes (Signed)
[facility] DEPT Provider Note   CSN: [id number] Arrival date & time: 12/03/17  1125     History   Chief Complaint Chief Complaint  Patient presents with  . Hypertension    HPI DAYN BARICH is a 77 y.o. male.  HPI   Was seen Friday, had vomiting, diarrhea, blood pressure was elevated Started lisinopril Saturday AM, began taking it at [facility], now adjusted to AM, 10mg  lisinopril, Saturday blood pressures looked ok Since then, checked blood pressure this AM and was very high 226/118, went to apothecary to see what it was, went to drug store and it was still high. 1.5hr after taking medicine was high Went to doctor's office in [place] but was not going to be his PCP so didn't see him No symptoms No chest pain, he denies dyspnea, wife notes when he gets up and walks around he has had some dyspnea, cough No numbness or weakness, no headache, no change in speech, no change in vision  Past Medical History:  Diagnosis Date  . Hypertension     Patient Active Problem List   Diagnosis Date Noted  . ABSCESS, TOOTH 12/08/2008  . MICROCYTIC ANEMIA 02/09/2007  . NEOP, NOS, BLADDER 02/02/2007  . HYPERLIPIDEMIA 02/02/2007  . HYPERTENSION 02/02/2007  . ALLERGIC RHINITIS 02/02/2007  . GERD 02/02/2007  . ARTHRITIS 02/02/2007  . DYSPHAGIA, UNSPECIFIED 02/02/2007    History reviewed. No pertinent surgical history.      Home Medications    Prior to Admission medications   Medication Sig Start Date End Date Taking? Authorizing Provider  calcium carbonate (TUMS - DOSED IN MG ELEMENTAL CALCIUM) 500 MG chewable tablet Chew 1 tablet by mouth 2 (two) times daily.   Yes [provider]  Chlorpheniramine-Acetaminophen (CORICIDIN HBP COLD/FLU PO) Take 1 tablet by mouth daily.   Yes [provider]  lisinopril (PRINIVIL,ZESTRIL) 10 MG tablet Take 1 tablet (10 mg total) by mouth daily. 11/28/17  Yes Malvin Johns, MD  magnesium citrate SOLN  Take 1 Bottle by mouth daily.   Yes [provider]  Polyethyl Glycol-Propyl Glycol (SYSTANE OP) Place 2 drops into both eyes daily as needed (red eye/allergies).   Yes [provider]  SENNA PO Take 1 tablet by mouth daily.   Yes [provider]  hydrochlorothiazide (HYDRODIURIL) 25 MG tablet Take 1 tablet (25 mg total) by mouth daily. 12/03/17   Gareth Morgan, MD  ondansetron (ZOFRAN ODT) 4 MG disintegrating tablet 4mg  ODT q4 hours prn nausea/vomit Patient not taking: Reported on 12/03/2017 11/28/17   Malvin Johns, MD  potassium chloride (K-DUR) 10 MEQ tablet Take 2 tablets (20 mEq total) by mouth 2 (two) times daily for 7 days. 12/03/17 12/10/17  Gareth Morgan, MD    Family History No family history on file.  Social History Social History   Tobacco Use  . Smoking status: Never Smoker  . Smokeless tobacco: Never Used  Substance Use Topics  . Alcohol use: Not on file  . Drug use: Not on file     Allergies   Patient has no known allergies.   Review of Systems Review of Systems  Constitutional: Negative for fever.  HENT: Negative for sore throat.   Eyes: Negative for visual disturbance.  Respiratory: Negative for shortness of breath.   Cardiovascular: Negative for chest pain.  Gastrointestinal: Negative for abdominal pain.  Genitourinary: Negative for difficulty urinating.  Musculoskeletal: Negative for back pain and neck stiffness.  Skin: Negative for rash.  Neurological: Negative for  syncope and headaches.     Physical Exam Updated Vital Signs BP (!) 205/89 (BP Location: Left Arm)   Pulse (!) 58   Temp 97.6 F (36.4 C) (Oral)   Resp 18   SpO2 100%   Physical Exam  Constitutional: He is oriented to person, place, and time. He appears well-developed and well-nourished. No distress.  HENT:  Head: Normocephalic and atraumatic.  Eyes: Conjunctivae and EOM are normal.  Neck: Normal range of motion.  Cardiovascular: Normal rate, regular  rhythm, normal heart sounds and intact distal pulses. Exam reveals no gallop and no friction rub.  No murmur heard. Pulmonary/Chest: Effort normal and breath sounds normal. No respiratory distress. He has no wheezes. He has no rales.  Abdominal: Soft. He exhibits no distension. There is no tenderness. There is no guarding.  Musculoskeletal: He exhibits no edema.  Neurological: He is alert and oriented to person, place, and time.  Skin: Skin is warm and dry. He is not diaphoretic.  Nursing note and vitals reviewed.    ED Treatments / Results  Labs (all labs ordered are listed, but only abnormal results are displayed) Labs Reviewed  BASIC METABOLIC PANEL - Abnormal; Notable for the following components:      Result Value   Potassium 2.9 (*)    Chloride 99 (*)    Glucose, Bld 101 (*)    Calcium 8.6 (*)    GFR calc non Af Amer 59 (*)    All other components within normal limits  I-STAT CHEM 8, ED - Abnormal; Notable for the following components:   Potassium 2.8 (*)    Chloride 97 (*)    Calcium, Ion 1.05 (*)    All other components within normal limits    EKG None  Radiology No results found.  Procedures Procedures (including critical care time)  Medications Ordered in ED Medications  potassium chloride SA (K-DUR,KLOR-CON) CR tablet 40 mEq (40 mEq Oral Given 12/03/17 1430)  hydrochlorothiazide (MICROZIDE) capsule 25 mg (25 mg Oral Given 12/03/17 1430)     Initial Impression / Assessment and Plan / ED Course  I have reviewed the triage vital signs and the nursing notes.  Pertinent labs & imaging results that were available during my care of the patient were reviewed by me and considered in my medical decision making (see chart for details).     77yo male with history above including recent diagnosis of hypertension in the ED on Friday presents with concern for blood pressures with continued evaluation despite taking lisinopril.  On arrival to the ED, BP was 208/90.   Patient without headache, no neurologic symptoms, no chest pain, no shortness of breath and have low suspicion for hypertensive emergencies including low suspicion for [facility], hypertensive encephalopathy, stroke, MI, aortic dissection, pulmonary edema.  BMP was checked showing normal renal function, hypokalemia. Given patient has been taking medications for nearly one week with continued htn, feel initiating second agent is appropriate.  Pt with hypokalemia, given 32meq in  ED, will initiate on BID K for next week particularly in the setting of starting hctz. Discussed importance of close primary care follow up for recheck K and Cr and reasons to return to the ED in detail. Patient discharged in stable condition with understanding of reasons to return.    Final Clinical Impressions(s) / ED Diagnoses   Final diagnoses:  Hypokalemia  Essential hypertension    ED Discharge Orders        Ordered    hydrochlorothiazide (HYDRODIURIL) 25  MG tablet  Daily     12/03/17 1430    potassium chloride (K-DUR) 10 MEQ tablet  2 times daily     12/03/17 1430       Gareth Morgan, MD 12/03/17 2226

## 2017-12-03 NOTE — ED Triage Notes (Signed)
Pt reports that he was seen here on Friday and given medications for HTN. Reports took BP at home and at drug store and was reading 200s/100s.  Pt doesn't have a PCP

## 2017-12-05 DIAGNOSIS — Z79899 Other long term (current) drug therapy: Secondary | ICD-10-CM | POA: Diagnosis not present

## 2017-12-05 DIAGNOSIS — R7301 Impaired fasting glucose: Secondary | ICD-10-CM | POA: Diagnosis not present

## 2017-12-05 DIAGNOSIS — I10 Essential (primary) hypertension: Secondary | ICD-10-CM | POA: Diagnosis not present

## 2018-01-06 ENCOUNTER — Other Ambulatory Visit: Payer: Self-pay | Admitting: Family Medicine

## 2018-01-06 ENCOUNTER — Ambulatory Visit
Admission: RE | Admit: 2018-01-06 | Discharge: 2018-01-06 | Disposition: A | Discharge status: Home / Self Care | Payer: Medicare Other | Source: Ambulatory Visit | Attending: Family Medicine | Admitting: Family Medicine

## 2018-01-06 DIAGNOSIS — Z01818 Encounter for other preprocedural examination: Secondary | ICD-10-CM

## 2019-05-21 ENCOUNTER — Other Ambulatory Visit: Payer: Self-pay

## 2019-05-21 DIAGNOSIS — Z20822 Contact with and (suspected) exposure to covid-19: Secondary | ICD-10-CM

## 2019-05-22 LAB — NOVEL CORONAVIRUS, NAA: SARS-CoV-2, NAA: NOT DETECTED

## 2019-05-24 ENCOUNTER — Telehealth: Payer: Self-pay | Admitting: General Practice

## 2019-05-24 NOTE — Telephone Encounter (Signed)
Negative COVID results given. Patient results "NOT Detected." Caller expressed understanding. ° °

## 2019-08-30 IMAGING — CR DG ABDOMEN ACUTE W/ 1V CHEST
4 series · 4 of 4 positions shown · non-contrast
Comparison: 01/31/2009

CLINICAL DATA: Per order- cough and vomiting Pt reports sudden
onset of N/V/D this morning. Pt states he also has a dry cough
today.PT HX: non smoker

EXAM:
DG ABDOMEN ACUTE W/ 1V CHEST

[x chest ap]
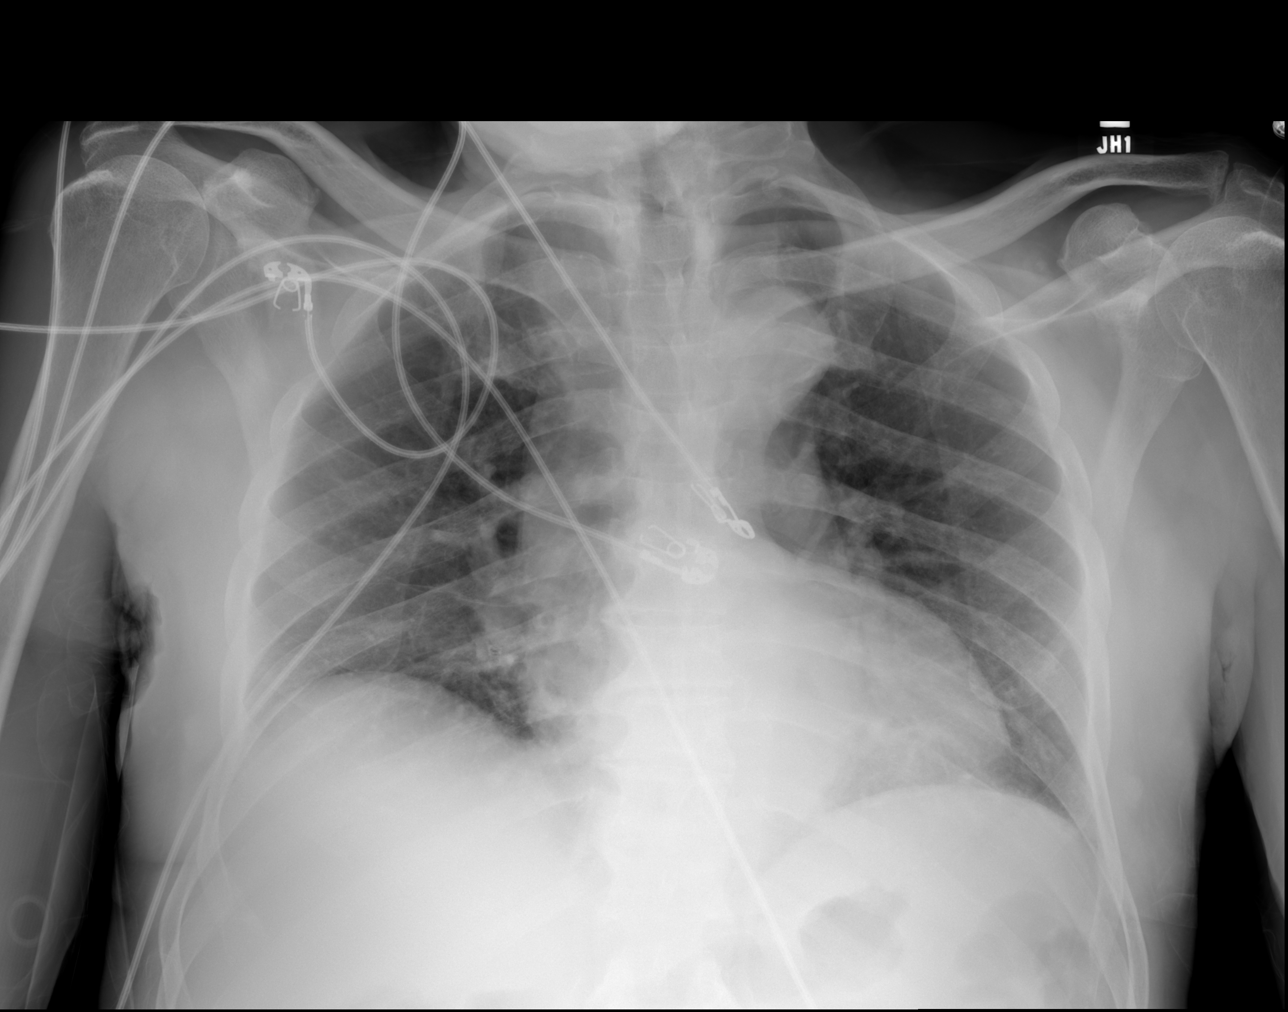

[x abdomen supine (1 of 2)]
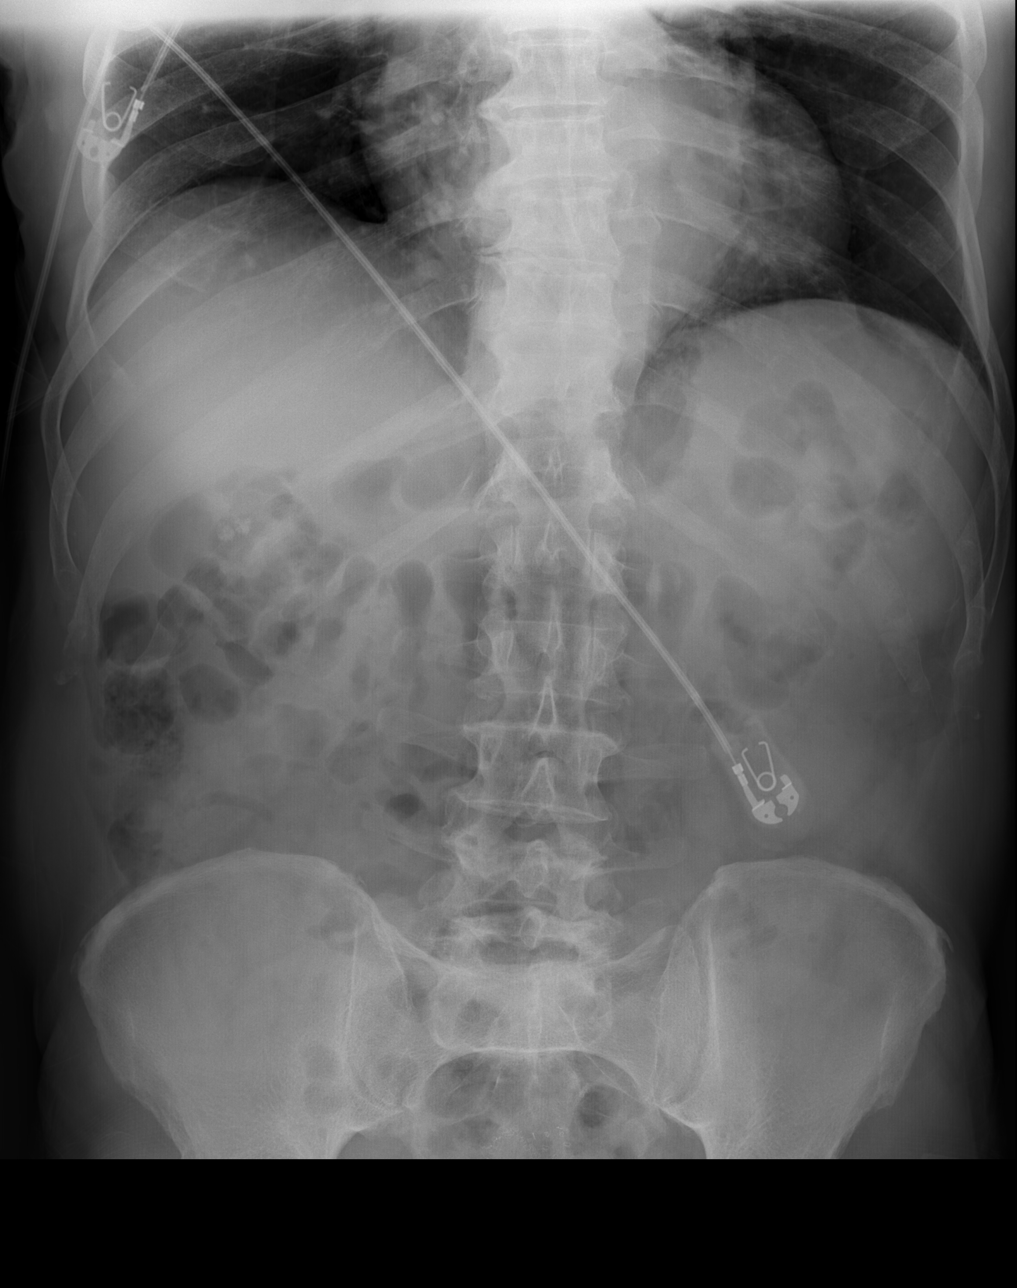

[x abdomen supine (2 of 2)]
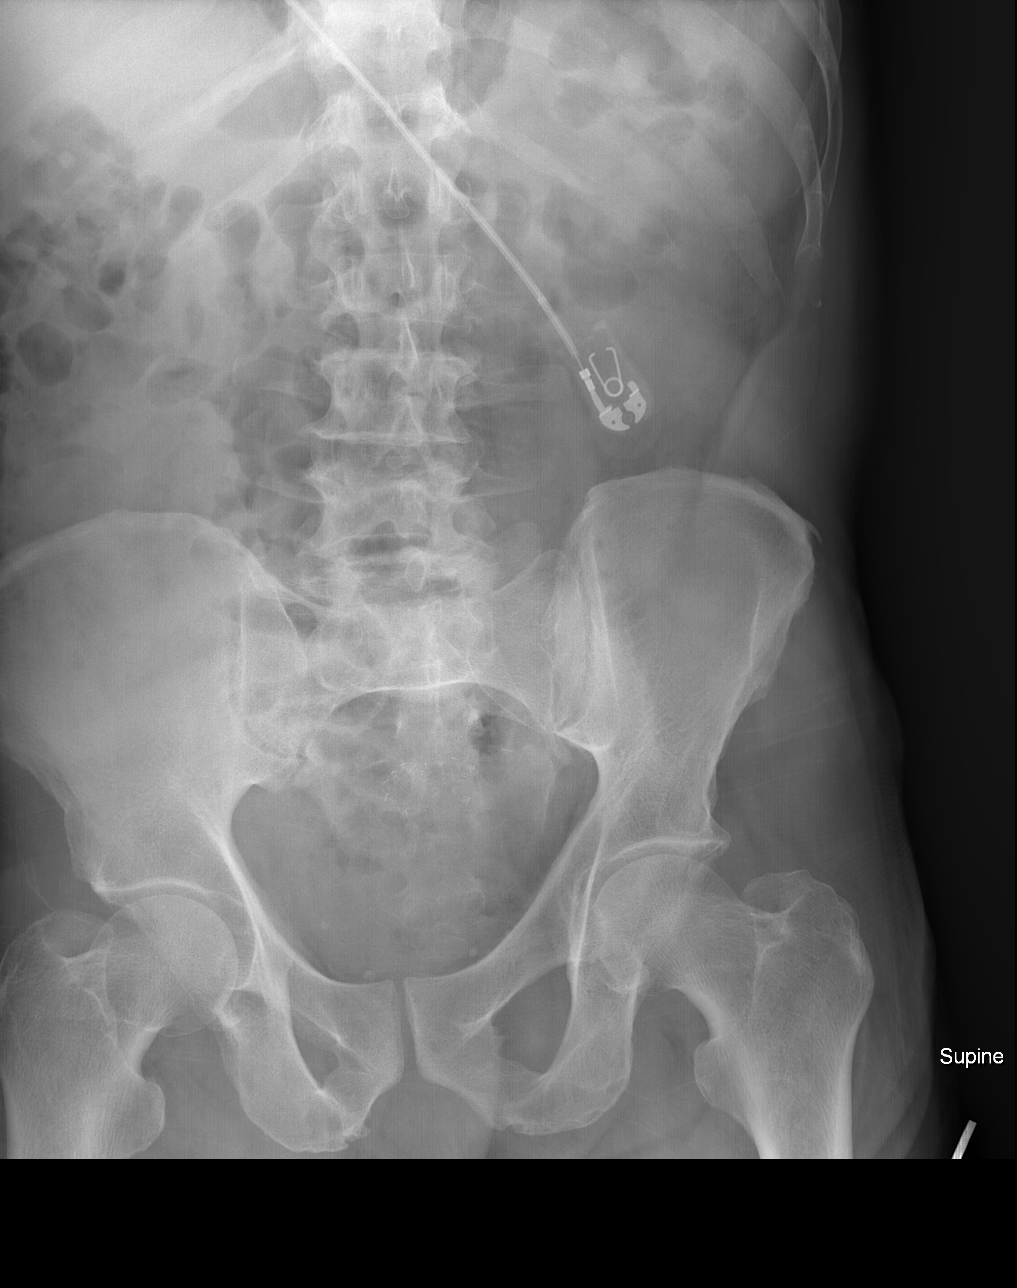

[w abdomen decub]
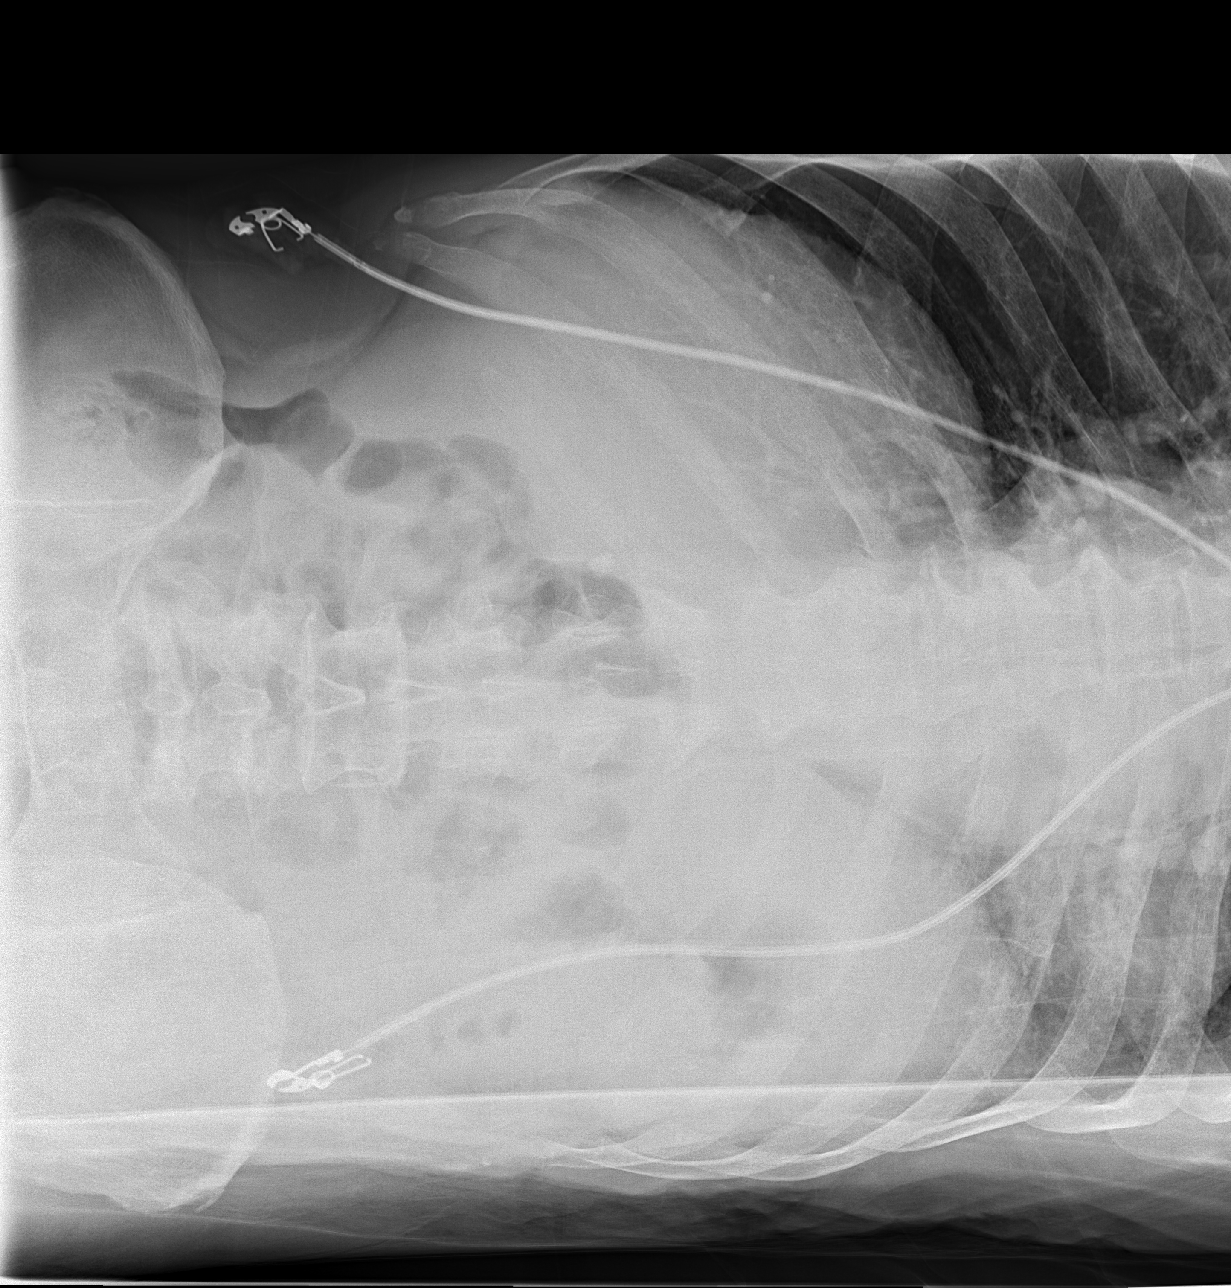

[4 of 4 positions shown; findings below may reference images not displayed]

FINDINGS: There is no bowel dilation to suggest obstruction or generalized
adynamic ileus. No air-fluid levels. No free air.

There is a bowel anastomosis staple line in the central pelvis.

Two calcifications are noted in the right upper quadrant consistent
with gallstones. No evidence of renal or ureteral stones. Soft
tissues are otherwise unremarkable.

Heart is top-normal in size. No mediastinal or hilar masses. Clear
lungs.

No acute skeletal abnormality.
IMPRESSION: 1. No acute findings. No evidence of bowel obstruction or
generalized adynamic ileus. No free air.
2. Gallstones.
3. No active cardiopulmonary disease.

## 2020-11-22 DIAGNOSIS — A088 Other specified intestinal infections: Secondary | ICD-10-CM | POA: Diagnosis not present

## 2020-11-22 DIAGNOSIS — K529 Noninfective gastroenteritis and colitis, unspecified: Secondary | ICD-10-CM | POA: Diagnosis not present

## 2021-01-15 DIAGNOSIS — Z0001 Encounter for general adult medical examination with abnormal findings: Secondary | ICD-10-CM | POA: Diagnosis not present

## 2021-01-15 DIAGNOSIS — R7303 Prediabetes: Secondary | ICD-10-CM | POA: Diagnosis not present

## 2021-01-15 DIAGNOSIS — Z23 Encounter for immunization: Secondary | ICD-10-CM | POA: Diagnosis not present

## 2021-01-15 DIAGNOSIS — R7309 Other abnormal glucose: Secondary | ICD-10-CM | POA: Diagnosis not present

## 2021-01-15 DIAGNOSIS — N1831 Chronic kidney disease, stage 3a: Secondary | ICD-10-CM | POA: Diagnosis not present

## 2021-01-15 DIAGNOSIS — I10 Essential (primary) hypertension: Secondary | ICD-10-CM | POA: Diagnosis not present

## 2021-01-15 DIAGNOSIS — Z79899 Other long term (current) drug therapy: Secondary | ICD-10-CM | POA: Diagnosis not present

## 2022-01-23 DIAGNOSIS — Z79899 Other long term (current) drug therapy: Secondary | ICD-10-CM | POA: Diagnosis not present

## 2022-01-23 DIAGNOSIS — N1831 Chronic kidney disease, stage 3a: Secondary | ICD-10-CM | POA: Diagnosis not present

## 2022-01-23 DIAGNOSIS — Z0000 Encounter for general adult medical examination without abnormal findings: Secondary | ICD-10-CM | POA: Diagnosis not present

## 2022-01-23 DIAGNOSIS — R7303 Prediabetes: Secondary | ICD-10-CM | POA: Diagnosis not present

## 2022-01-23 DIAGNOSIS — I10 Essential (primary) hypertension: Secondary | ICD-10-CM | POA: Diagnosis not present

## 2022-05-01 DIAGNOSIS — E7800 Pure hypercholesterolemia, unspecified: Secondary | ICD-10-CM | POA: Diagnosis not present

## 2022-06-10 DIAGNOSIS — E7800 Pure hypercholesterolemia, unspecified: Secondary | ICD-10-CM | POA: Diagnosis not present

## 2022-06-10 DIAGNOSIS — I10 Essential (primary) hypertension: Secondary | ICD-10-CM | POA: Diagnosis not present

## 2022-06-10 DIAGNOSIS — N1831 Chronic kidney disease, stage 3a: Secondary | ICD-10-CM | POA: Diagnosis not present

## 2023-01-28 DIAGNOSIS — I10 Essential (primary) hypertension: Secondary | ICD-10-CM | POA: Diagnosis not present

## 2023-01-28 DIAGNOSIS — E7800 Pure hypercholesterolemia, unspecified: Secondary | ICD-10-CM | POA: Diagnosis not present

## 2023-01-28 DIAGNOSIS — Z0001 Encounter for general adult medical examination with abnormal findings: Secondary | ICD-10-CM | POA: Diagnosis not present

## 2023-01-28 DIAGNOSIS — Z79899 Other long term (current) drug therapy: Secondary | ICD-10-CM | POA: Diagnosis not present

## 2023-01-28 DIAGNOSIS — R7303 Prediabetes: Secondary | ICD-10-CM | POA: Diagnosis not present

## 2023-01-28 DIAGNOSIS — R7309 Other abnormal glucose: Secondary | ICD-10-CM | POA: Diagnosis not present

## 2023-01-28 DIAGNOSIS — N1831 Chronic kidney disease, stage 3a: Secondary | ICD-10-CM | POA: Diagnosis not present
